# Patient Record
Sex: Female | Born: 1985 | Race: White | Hispanic: No | Marital: Married | Smoking: Former smoker
Health system: Southern US, Community
[De-identification: ages and names within clinical notes are randomized; demographics above are authoritative.]

## PROBLEM LIST (undated history)

## (undated) DIAGNOSIS — Z950 Presence of cardiac pacemaker: Secondary | ICD-10-CM

## (undated) DIAGNOSIS — I509 Heart failure, unspecified: Secondary | ICD-10-CM

## (undated) HISTORY — PX: CARDIAC CATHETERIZATION: SHX172

## (undated) HISTORY — PX: PACEMAKER PLACEMENT: SHX43

---

## 2010-01-16 ENCOUNTER — Emergency Department: Payer: Self-pay | Admitting: Emergency Medicine

## 2010-01-26 ENCOUNTER — Emergency Department: Payer: Self-pay | Admitting: Emergency Medicine

## 2011-07-17 ENCOUNTER — Emergency Department: Payer: Self-pay | Admitting: Emergency Medicine

## 2012-09-30 ENCOUNTER — Emergency Department: Payer: Self-pay | Admitting: Emergency Medicine

## 2012-09-30 LAB — COMPREHENSIVE METABOLIC PANEL
BUN: 10 mg/dL (ref 7–18)
Calcium, Total: 9.3 mg/dL (ref 8.5–10.1)
Chloride: 109 mmol/L — ABNORMAL HIGH (ref 98–107)
Creatinine: 0.79 mg/dL (ref 0.60–1.30)
EGFR (African American): >60
EGFR (Non-African Amer.): >60
Glucose: 84 mg/dL (ref 65–99)
Osmolality: 281 (ref 275–301)
SGOT(AST): 25 U/L (ref 15–37)
Sodium: 142 mmol/L (ref 136–145)
Total Protein: 7.2 g/dL (ref 6.4–8.2)

## 2012-09-30 LAB — CBC WITH DIFFERENTIAL/PLATELET
Eosinophil #: 0.2 10*3/uL (ref 0.0–0.7)
Eosinophil %: 2.2 %
HCT: 35 % (ref 35.0–47.0)
HGB: 11.7 g/dL — ABNORMAL LOW (ref 12.0–16.0)
Lymphocyte %: 31.3 %
MCHC: 33.5 g/dL (ref 32.0–36.0)
MCV: 84 fL (ref 80–100)
Monocyte #: 0.4 x10 3/mm (ref 0.2–0.9)
Monocyte %: 4.8 %
Neutrophil #: 4.9 10*3/uL (ref 1.4–6.5)
Neutrophil %: 60.5 %
RBC: 4.18 10*6/uL (ref 3.80–5.20)
RDW: 14.5 % (ref 11.5–14.5)

## 2012-09-30 LAB — TROPONIN I: Troponin-I: <0.02 ng/mL

## 2013-06-02 LAB — URINALYSIS, COMPLETE
Bacteria: NONE SEEN
Bilirubin,UR: NEGATIVE
Glucose,UR: NEGATIVE mg/dL (ref 0–75)
KETONE: NEGATIVE
Leukocyte Esterase: NEGATIVE
Nitrite: NEGATIVE
PH: 8 (ref 4.5–8.0)
Protein: NEGATIVE
RBC,UR: 1 /HPF (ref 0–5)
SPECIFIC GRAVITY: 1.011 (ref 1.003–1.030)
WBC UR: 2 /HPF (ref 0–5)

## 2013-06-02 LAB — COMPREHENSIVE METABOLIC PANEL
ALK PHOS: 78 U/L
ANION GAP: 4 — AB (ref 7–16)
AST: 20 U/L (ref 15–37)
Albumin: 3.7 g/dL (ref 3.4–5.0)
BUN: 9 mg/dL (ref 7–18)
Bilirubin,Total: 0.2 mg/dL (ref 0.2–1.0)
CHLORIDE: 109 mmol/L — AB (ref 98–107)
CO2: 27 mmol/L (ref 21–32)
Calcium, Total: 8.8 mg/dL (ref 8.5–10.1)
Creatinine: 0.81 mg/dL (ref 0.60–1.30)
EGFR (African American): >60
EGFR (Non-African Amer.): >60
Glucose: 79 mg/dL (ref 65–99)
OSMOLALITY: 277 (ref 275–301)
POTASSIUM: 3.5 mmol/L (ref 3.5–5.1)
SGPT (ALT): 19 U/L (ref 12–78)
Sodium: 140 mmol/L (ref 136–145)
Total Protein: 7.3 g/dL (ref 6.4–8.2)

## 2013-06-02 LAB — CBC
HCT: 37.2 % (ref 35.0–47.0)
HGB: 12.4 g/dL (ref 12.0–16.0)
MCH: 28.6 pg (ref 26.0–34.0)
MCHC: 33.4 g/dL (ref 32.0–36.0)
MCV: 86 fL (ref 80–100)
PLATELETS: 206 10*3/uL (ref 150–440)
RBC: 4.34 10*6/uL (ref 3.80–5.20)
RDW: 13.8 % (ref 11.5–14.5)
WBC: 8.7 10*3/uL (ref 3.6–11.0)

## 2013-06-02 LAB — CK TOTAL AND CKMB (NOT AT ARMC)
CK, Total: 58 U/L
CK-MB: <0.5 ng/mL — ABNORMAL LOW (ref 0.5–3.6)

## 2013-06-02 LAB — TROPONIN I: Troponin-I: <0.02 ng/mL

## 2013-06-02 LAB — LIPASE, BLOOD: Lipase: 117 U/L (ref 73–393)

## 2013-06-03 ENCOUNTER — Observation Stay: Payer: Self-pay | Admitting: Internal Medicine

## 2013-06-03 LAB — TROPONIN I: Troponin-I: <0.02 ng/mL

## 2013-06-03 LAB — CK-MB: CK-MB: 0.6 ng/mL (ref 0.5–3.6)

## 2013-07-26 ENCOUNTER — Emergency Department: Payer: Self-pay | Admitting: Emergency Medicine

## 2013-07-26 LAB — URINALYSIS, COMPLETE
BILIRUBIN, UR: NEGATIVE
Bacteria: NONE SEEN
Glucose,UR: NEGATIVE mg/dL (ref 0–75)
Ketone: NEGATIVE
LEUKOCYTE ESTERASE: NEGATIVE
NITRITE: NEGATIVE
Ph: 5 (ref 4.5–8.0)
Protein: NEGATIVE
RBC,UR: <1 /HPF (ref 0–5)
SPECIFIC GRAVITY: 1.005 (ref 1.003–1.030)
Squamous Epithelial: 6

## 2013-07-26 LAB — CBC WITH DIFFERENTIAL/PLATELET
Basophil #: 0 10*3/uL (ref 0.0–0.1)
Basophil %: 0.5 %
Eosinophil #: 0.2 10*3/uL (ref 0.0–0.7)
Eosinophil %: 2.3 %
HCT: 40.2 % (ref 35.0–47.0)
HGB: 12.9 g/dL (ref 12.0–16.0)
LYMPHS PCT: 32 %
Lymphocyte #: 2.6 10*3/uL (ref 1.0–3.6)
MCH: 28 pg (ref 26.0–34.0)
MCHC: 32.2 g/dL (ref 32.0–36.0)
MCV: 87 fL (ref 80–100)
MONO ABS: 0.5 x10 3/mm (ref 0.2–0.9)
MONOS PCT: 5.7 %
NEUTROS PCT: 59.5 %
Neutrophil #: 4.8 10*3/uL (ref 1.4–6.5)
PLATELETS: 211 10*3/uL (ref 150–440)
RBC: 4.62 10*6/uL (ref 3.80–5.20)
RDW: 13.1 % (ref 11.5–14.5)
WBC: 8.2 10*3/uL (ref 3.6–11.0)

## 2013-07-26 LAB — COMPREHENSIVE METABOLIC PANEL
Albumin: 4 g/dL (ref 3.4–5.0)
Alkaline Phosphatase: 81 U/L
Anion Gap: 5 — ABNORMAL LOW (ref 7–16)
BILIRUBIN TOTAL: 0.2 mg/dL (ref 0.2–1.0)
BUN: 10 mg/dL (ref 7–18)
CALCIUM: 9.4 mg/dL (ref 8.5–10.1)
Chloride: 109 mmol/L — ABNORMAL HIGH (ref 98–107)
Co2: 25 mmol/L (ref 21–32)
Creatinine: 0.88 mg/dL (ref 0.60–1.30)
EGFR (Non-African Amer.): >60
Glucose: 81 mg/dL (ref 65–99)
OSMOLALITY: 276 (ref 275–301)
Potassium: 4.1 mmol/L (ref 3.5–5.1)
SGOT(AST): 18 U/L (ref 15–37)
SGPT (ALT): 14 U/L (ref 12–78)
SODIUM: 139 mmol/L (ref 136–145)
Total Protein: 7.8 g/dL (ref 6.4–8.2)

## 2013-07-26 LAB — WET PREP, GENITAL

## 2013-07-26 LAB — HCG, QUANTITATIVE, PREGNANCY

## 2013-07-26 LAB — GC/CHLAMYDIA PROBE AMP

## 2013-10-04 ENCOUNTER — Emergency Department: Payer: Self-pay | Admitting: Internal Medicine

## 2013-10-04 LAB — COMPREHENSIVE METABOLIC PANEL
AST: 20 U/L (ref 15–37)
Albumin: 3.9 g/dL (ref 3.4–5.0)
Alkaline Phosphatase: 77 U/L
Anion Gap: 6 — ABNORMAL LOW (ref 7–16)
BUN: 6 mg/dL — ABNORMAL LOW (ref 7–18)
Bilirubin,Total: 0.3 mg/dL (ref 0.2–1.0)
CO2: 25 mmol/L (ref 21–32)
Calcium, Total: 9.4 mg/dL (ref 8.5–10.1)
Chloride: 107 mmol/L (ref 98–107)
Creatinine: 0.88 mg/dL (ref 0.60–1.30)
EGFR (African American): >60
GLUCOSE: 129 mg/dL — AB (ref 65–99)
OSMOLALITY: 275 (ref 275–301)
Potassium: 4 mmol/L (ref 3.5–5.1)
SGPT (ALT): 22 U/L (ref 12–78)
SODIUM: 138 mmol/L (ref 136–145)
Total Protein: 8 g/dL (ref 6.4–8.2)

## 2013-10-04 LAB — WET PREP, GENITAL

## 2013-10-04 LAB — URINALYSIS, COMPLETE
BILIRUBIN, UR: NEGATIVE
Blood: NEGATIVE
Glucose,UR: NEGATIVE mg/dL (ref 0–75)
Ketone: NEGATIVE
LEUKOCYTE ESTERASE: NEGATIVE
Nitrite: NEGATIVE
Ph: 7 (ref 4.5–8.0)
Protein: NEGATIVE
Specific Gravity: 1.016 (ref 1.003–1.030)
WBC UR: 3 /HPF (ref 0–5)

## 2013-10-04 LAB — CBC WITH DIFFERENTIAL/PLATELET
Basophil #: 0.1 10*3/uL (ref 0.0–0.1)
Basophil %: 0.7 %
Eosinophil #: 0.2 10*3/uL (ref 0.0–0.7)
Eosinophil %: 1.5 %
HCT: 40.3 % (ref 35.0–47.0)
HGB: 13.2 g/dL (ref 12.0–16.0)
LYMPHS PCT: 21.9 %
Lymphocyte #: 2.3 10*3/uL (ref 1.0–3.6)
MCH: 28.6 pg (ref 26.0–34.0)
MCHC: 32.6 g/dL (ref 32.0–36.0)
MCV: 88 fL (ref 80–100)
MONO ABS: 0.3 x10 3/mm (ref 0.2–0.9)
Monocyte %: 2.9 %
Neutrophil #: 7.6 10*3/uL — ABNORMAL HIGH (ref 1.4–6.5)
Neutrophil %: 73 %
Platelet: 215 10*3/uL (ref 150–440)
RBC: 4.6 10*6/uL (ref 3.80–5.20)
RDW: 13.7 % (ref 11.5–14.5)
WBC: 10.4 10*3/uL (ref 3.6–11.0)

## 2013-10-04 LAB — GC/CHLAMYDIA PROBE AMP

## 2014-06-21 ENCOUNTER — Emergency Department
Admit: 2014-06-21 | Disposition: A | Discharge status: Home / Self Care | Payer: Self-pay | Admitting: Emergency Medicine

## 2014-06-27 ENCOUNTER — Emergency Department
Admit: 2014-06-27 | Disposition: A | Discharge status: Home / Self Care | Payer: Self-pay | Admitting: Emergency Medicine

## 2014-07-15 NOTE — Consult Note (Signed)
PATIENT NAME:  Tina Mora, Tina Mora MR#:  [id number] DATE OF BIRTH:  [birth year]  DATE OF CONSULTATION:  06/03/2013  REFERRING PHYSICIAN:  Cletis Athens. Hower, MD CONSULTING PHYSICIAN:  Nollie Terlizzi D. Guadalupe Kerekes, MD  INDICATION: Aortic stenosis and atypical chest pain, known congenital heart disease.   HISTORY OF PRESENT ILLNESS: Ms. Tina Mora is a 29 year old white female with history of pediatric heart defect with congenital disease. 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 She does have a history of permanent pacemaker for sick sinus syndrome. Began with chest pain symptoms. Describes chest pain over the last 3 days, constant,  unrelenting, retrosternal, sharp in nature, 10/10. Has had constant chest pain without radiation. No factors that relieve or worsen the pain. She associates it with mild shortness of breath and occasional nausea but no vomiting. Denies fever, chills, sweats, weight loss, weight gain. She had received morphine in the Emergency Room, which seemed to help her symptoms. She says she has tried aspirin at home and some NSAIDs. She has had pain like this in the past. Has not followed up with her cardiologist at [facility] in several years. She is supposed to see Dr. Jackson Latino at [facility] and Dr. Mary Sella with EP, but for some has not followed up.   REVIEW OF SYSTEMS: No blackout spells or syncope. Denies nausea or vomiting. No fever, no chills, no sweats, no weight loss, no weight gain. No hemoptysis or hematemesis. No bright red blood per rectum.   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   PAST SURGICAL HISTORY: She has had open heart surgeries for correction of tetralogy, as well as permanent pacemaker placement. She has had revision of the pacemaker as well  years ago.   SOCIAL HISTORY: She says she quit smoking 18 months ago. No alcohol consumption. Lives at home with I think a  boyfriend. She has had 2 children which were adopted from her, but she has stepchildren.   FAMILY HISTORY: Coronary artery disease, CVA, COPD.   ALLERGIES: AUGMENTIN.   HOME MEDICATIONS: None.   PHYSICAL EXAMINATION: VITAL SIGNS: Blood pressure was 140/50, heart rate of 70, respiratory rate of 18, afebrile.  HEENT: Normocephalic, atraumatic. Pupils equal and reactive to light.  NECK: Supple. No significant JVD, bruits or adenopathy.  LUNGS: Clear to auscultation and percussion. No significant wheeze, rhonchi or rale.  HEART: Regular rate and rhythm. Systolic ejection murmur at the  left sternal border, harsh III/VI systolic ejection murmur. Positive S4.  ABDOMEN: Benign.  EXTREMITIES: Within normal limits.  NEUROLOGIC: Intact.  SKIN: Normal.   LABORATORY, DIAGNOSTIC, AND RADIOLOGICAL DATA: EKG: Paced rhythm at 70. Chest x-ray unremarkable. CT of the chest showed no pulmonary embolus. Cardiac anomaly. Tortuosity of the aortic arch. Descending aorta has normal caliber. Right subclavian artery courses posterior to the esophagus.   Sodium 140, potassium 3.5, chloride 109, bicarbonate 27, BUN 9, creatinine 0.81, glucose of 79. LFTs normal. Troponin less than 0.02. White count of 8.7, hemoglobin 12.4, platelet count of 26. Urinalysis was negative.   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   PLAN:  1.  Atypical chest pain probably. I do not believe that she has an acute coronary syndrome. I do not believe she has pericarditis. Would recommend conservative therapy in terms of the pain. Do not recommend narcotics  at this point. Will have the patient follow up with her regular cardiologist for further evaluation.  2.  For murmur, she appears to have aortic stenosis on echocardiogram. Would [facility] for [facility] treatment and  intervention.  3.  For pacemaker, will [facility]. She probably needs pacemaker followup, which she has not been doing. 4.  I have advised the patient to continue to refrain from smoking.  5.  Once the patient has 3 negative cardiac enzymes, will treat the patient conservatively medically. I am concerned about compliance. It is her biggest problem at this point, for unclear reasons, but I do not believe that her chest pain is truly acute coronary syndrome, so continued narcotic therapy I think should be discouraged and have the patient follow up, as she should, with [facility].  ____________________________ Bobbie Stackwayne D. Juliann Paresallwood, MD ddc:jcm D: 06/04/2013 14:09:02 ET T: 06/04/2013 19:53:04 ET JOB#: [id number]  cc: Chayanne Filippi D. Juliann Paresallwood, MD, <Dictator> Alwyn PeaWAYNE D Esli Clements MD ELECTRONICALLY SIGNED 07/05/2013 12:34

## 2014-07-15 NOTE — Consult Note (Signed)
Brief Consult Note: Diagnosis: AS/Murmur /Atypical CP.   Patient was seen by consultant.   Consult note dictated.   Discussed with Attending MD.   Comments: IMP Atypical CP left ear Murmur SSS Obesity Smoking (quit) Congenital Heart disease . PLAN ASA81 daily PPM interregation as an outpt [facility]/Congenital Heart disease I do not rec narcotics for cp Consider ultram/NSAIDs I do not believe this is ACS I believe it is ok to have her f/u as out.  Electronic Signatures: Dorothyann Pengallwood, Dwayne D (MD)  (Signed 13-Mar-15 16:34)  Authored: Brief Consult Note   Last Updated: 13-Mar-15 16:34 by Alwyn Peaallwood, Dwayne D (MD)

## 2014-07-15 NOTE — Discharge Summary (Signed)
PATIENT NAME:  Tina Mora, Tina Mora MR#:  [id number] DATE OF BIRTH:  1985/09/16  DATE OF ADMISSION:  06/03/2013  DATE OF DISCHARGE:  06/03/2013  DISCHARGE DIAGNOSES:  1.  Musculoskeletal chest pain.  2.  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   DISCHARGE MEDICATIONS: The patient advised to take ibuprofen 800 mg t.i.d. as needed for chest pain, but no other medications are listed.   CONSULTATIONS: Cardiology consult with Dr. Clayborn Bigness.   The patient advised to keep appointment with her cardiologist at [facility].   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 Came in because of chest pain. The patient did not have any followup recently with her cardiologist at [facility]. Did not see him at least for 2 years. The patient came in because of chest pain. The patient has a history of permanent pacemaker. The patient had chest pain of 3 days duration with no trouble breathing, no cough, no nausea, no vomiting. Troponins have been negative x 3. The patient's echocardiogram showed normal ejection fraction of 60%, with severe aortic stenosis. The patient was seen by Dr. Clayborn Bigness, recommended that she can follow up with her cardiologist as an outpatient. No emergency to transfer her to [facility]. The patient received aspirin and nitroglycerin and atorvastatin during the hospital stay, and she also started on morphine 2 to 4 mg q. 4 hours p.r.n. for pain. The patient's CT of the chest was done for pulmonary emboli, which did not show any PE. The patient's CBC and met-B were within normal limits. The patient continued to ask for pain medication, and we just stopped those. She was advised to follow up with her [place] cardiologist for congenital heart disease follow up. Dr. Clayborn Bigness said there is no reason to transfer her, and he did not believe it is acute coronary  syndrome, and we have stopped the narcotics. There was a question that she was needing narcotics on a regular basis, even though she was sleeping, so I have stopped those pain meds and we thought maybe she had narcotic-seeking behavior.   Anyway, she went home in stable condition. She has a history of obesity and smoking, but the chest pain is thought to be musculoskeletal. I advised her to just take ibuprofen as needed and follow up with the [facility] cardiologist.   Time spent on discharge preparation: More than 30 minutes.   Her primary doctor is from [place].    ____________________________ Epifanio Lesches, MD sk:mr D: 06/05/2013 10:41:00 ET T: 06/05/2013 20:17:07 ET JOB#: [id number]  cc: Epifanio Lesches, MD, <Dictator> Epifanio Lesches MD ELECTRONICALLY SIGNED 06/06/2013 12:17

## 2014-07-15 NOTE — H&P (Signed)
PATIENT NAME:  Tina Mora, DONNA MR#:  [id number] DATE OF BIRTH:  01-15-1986  DATE OF ADMISSION:  06/03/2013  REFERRING PHYSICIAN: Dr. Bayard Males.   PRIMARY CARE PHYSICIAN: None.   CHIEF COMPLAINT: Chest pain.   HISTORY OF PRESENT ILLNESS: A 29 year old Caucasian female with a past medical history of pediatric heart defects of which she is actually unsure of the details, although this is possible Tetralogy of Fallot. She does have a permanent pacemaker placed. She is presenting with chest pain. Describes chest pain for 3 days' duration, constant, retrosternal, sharp in the retrosternal location, sharp in quality, 10 out of 10 in intensity and once again pain has been constant without radiation. No worsening or relieving factors. She has associated mild shortness of breath as well as nausea. However, denies any fevers, chills, cough, abdominal pain or further symptomatology. She received morphine in the Emergency Department and now is without complaints.   REVIEW OF SYSTEMS: CONSTITUTIONAL: Denies fever, fatigue, weakness. EYES: Denies blurred vision, double vision, eye pain. EARS, NOSE, THROAT: Denies tinnitus, ear pain, hearing loss. RESPIRATORY: Denies cough, wheeze. Positive shortness of breath.  CARDIOVASCULAR: Positive for chest pain as described above. Denies any orthopnea, edema, palpitations. GASTROINTESTINAL: Denies nausea, vomiting, diarrhea, abdominal pain. GENITOURINARY: Denies dysuria, hematuria. ENDOCRINE: Denies nocturia or thyroid problems. HEMATOLOGY: Denies easy bruising or bleeding. SKIN: Denies rash or lesions.  MUSCULOSKELETAL: Denies pain in neck, back, shoulder, knees, hips or arthritic symptoms.  NEUROLOGIC: Denies paralysis, paresthesias. PSYCHIATRIC: Denies anxiety or depressive symptoms. Otherwise, full review of systems performed by me is negative.   PAST MEDICAL HISTORY: Pediatric heart defect. Her and her mother are unaware of the details, though from their past  recollection she has aortic stenosis, patent ductus arteriosus, coarctation of the aorta. She has had open heart surgery, though they are unaware of what was actually corrected. They also mention that she possibly has Tetralogy of Fallot.   SOCIAL HISTORY: Ex-tobacco use. Last smoked approximately 8 months ago. Denies any alcohol or drug use.   FAMILY HISTORY: Positive for coronary artery disease as well as CVAs in her father and COPD in mother.   ALLERGIES: AUGMENTIN.   HOME MEDICATIONS: She takes no medications.   PHYSICAL EXAMINATION:  VITAL SIGNS: Temperature 97.9, heart rate 70, respirations 22, blood pressure 139/38, saturating 99% on room air. Weight 77.1 kg, BMI 34.4.  GENERAL: Well-nourished, well-developed, Caucasian female, currently in no acute distress.  HEAD: Normocephalic, atraumatic. Eyes: Pupils equal, round, and reactive to light. Extraocular muscles intact. No scleral icterus. Mouth: Moist mucous membranes. Dentition intact. No abscess noted. Ears, nose and throat clear. No exudates. No xanthelasma.  NECK: Supple. No thyromegaly. No nodules. No JVD.  PULMONARY: Clear to auscultation bilaterally. No wheezes, rales or rhonchi. No use of accessory muscles. Good respiratory effort.  CHEST: Tender to palpation over the sternum reproducing similar symptoms, though milder in severity.  CARDIOVASCULAR: S1 and S2 with 3 out of  6 systolic ejection murmur best heard at right upper sternal border. No pedal edema. Pedal pulses 2+ bilaterally.  GASTROINTESTINAL: Soft, nontender, nondistended. No masses. Obese. Positive bowel sounds. No hepatosplenomegaly.  MUSCULOSKELETAL: No swelling, clubbing or edema. Range of motion full in all extremities.  NEUROLOGIC: Cranial nerves II through XII intact. No gross neurologic deficits. Sensation intact. Reflexes intact.  SKIN: No ulcerations, lesions, rashes, cyanosis. Skin warm and dry. Turgor intact.  PSYCHIATRIC: Mood and affect within normal  limits. The patient is awake, alert and oriented x 3. Insight and judgment intact.  LABORATORY DATA: EKG performed revealing ventricular paced rhythm. She also has a chest x-ray performed revealing no acute cardiopulmonary process; however, it does make mention of a permanent pacemaker as well as sternotomy wires. CT chest performed revealing no acute PE, no acute intrathoracic process. The cardiac anatomy is somewhat limited given the streak artifact and pacemaker; however, there is mild ectasia of the ascending aorta of 3.2 cm and tortuosity along the arch. Descending aorta is of normal caliber. Right subclavian artery courses posterior to the esophagus. Remainder of laboratory data: Sodium 140, potassium 3.5, chloride 109, bicarbonate 27, BUN 9, creatinine 0.81, glucose 79. LFTs within normal limits. Troponin I less than 0.02. WBC 8.7, hemoglobin 12.4, platelets of 206. Urinalysis negative for evidence of infection.   ASSESSMENT AND PLAN: A 29 year old Caucasian female with past medical history of pediatric heart defect, possibly Tetralogy of Fallot. She is status post repair given history of heart surgeries as well as sternotomy wires; however, she is unaware of the details, presenting with chest pain.  1.  Chest pain. She was given aspirin and placed on telemetry. She was given statin therapy. Cardiology consult. Trending cardiac enzymes x 3. For her cardiac history, she was previously followed by [facility]; however, she has been lost to followup and has not been seen in greater than 2 years. Transthoracic echocardiogram. 2.  VTE prophylaxis with heparin subcu.   CODE STATUS: The patient is full code.   TIME SPENT: 45 minutes.   ____________________________ Cletis Athensavid K. Zamyiah Tino, MD dkh:aw D: 06/03/2013 01:35:24 ET T: 06/03/2013 06:09:49 ET JOB#: [id number]  cc: Cletis Athensavid K. Angelise Petrich, MD, <Dictator> Eliakim Tendler Synetta ShadowK Kaile Bixler MD ELECTRONICALLY SIGNED 06/03/2013 20:33

## 2015-01-09 ENCOUNTER — Emergency Department
Admission: EM | Admit: 2015-01-09 | Discharge: 2015-01-09 | Discharge status: Against Medical Advice | Payer: Self-pay | Attending: Emergency Medicine | Admitting: Emergency Medicine

## 2015-01-09 ENCOUNTER — Encounter: Payer: Self-pay | Admitting: *Deleted

## 2015-01-09 DIAGNOSIS — R1031 Right lower quadrant pain: Secondary | ICD-10-CM | POA: Insufficient documentation

## 2015-01-09 DIAGNOSIS — R110 Nausea: Secondary | ICD-10-CM | POA: Insufficient documentation

## 2015-01-09 HISTORY — DX: Presence of cardiac pacemaker: Z95.0

## 2015-01-09 LAB — COMPREHENSIVE METABOLIC PANEL
ALK PHOS: 81 U/L (ref 38–126)
ALT: 27 U/L (ref 14–54)
AST: 24 U/L (ref 15–41)
Albumin: 4.2 g/dL (ref 3.5–5.0)
Anion gap: 6 (ref 5–15)
BUN: 9 mg/dL (ref 6–20)
CALCIUM: 9.6 mg/dL (ref 8.9–10.3)
CO2: 26 mmol/L (ref 22–32)
CREATININE: 0.7 mg/dL (ref 0.44–1.00)
Chloride: 108 mmol/L (ref 101–111)
GFR calc non Af Amer: >60 mL/min (ref >60)
Glucose, Bld: 121 mg/dL — ABNORMAL HIGH (ref 65–99)
Potassium: 3.6 mmol/L (ref 3.5–5.1)
SODIUM: 140 mmol/L (ref 135–145)
Total Bilirubin: 0.2 mg/dL — ABNORMAL LOW (ref 0.3–1.2)
Total Protein: 7.6 g/dL (ref 6.5–8.1)

## 2015-01-09 LAB — URINALYSIS COMPLETE WITH MICROSCOPIC (ARMC ONLY)
BILIRUBIN URINE: NEGATIVE
Glucose, UA: NEGATIVE mg/dL
KETONES UR: NEGATIVE mg/dL
Nitrite: NEGATIVE
Protein, ur: NEGATIVE mg/dL
Specific Gravity, Urine: 1.015 (ref 1.005–1.030)
pH: 7 (ref 5.0–8.0)

## 2015-01-09 LAB — LIPASE, BLOOD: Lipase: 25 U/L (ref 22–51)

## 2015-01-09 LAB — CBC
HCT: 37.1 % (ref 35.0–47.0)
Hemoglobin: 12.2 g/dL (ref 12.0–16.0)
MCH: 27.9 pg (ref 26.0–34.0)
MCHC: 32.8 g/dL (ref 32.0–36.0)
MCV: 85 fL (ref 80.0–100.0)
PLATELETS: 236 10*3/uL (ref 150–440)
RBC: 4.37 MIL/uL (ref 3.80–5.20)
RDW: 14.5 % (ref 11.5–14.5)
WBC: 7.6 10*3/uL (ref 3.6–11.0)

## 2015-01-09 LAB — POCT PREGNANCY, URINE: Preg Test, Ur: NEGATIVE

## 2015-01-09 NOTE — ED Notes (Signed)
Pt reports RLQ abdominal pain x 3 days, worsening. Nausea, no vomiting.

## 2015-01-13 ENCOUNTER — Emergency Department
Admission: EM | Admit: 2015-01-13 | Discharge: 2015-01-13 | Disposition: A | Discharge status: Home / Self Care | Payer: Self-pay | Attending: Emergency Medicine | Admitting: Emergency Medicine

## 2015-01-13 ENCOUNTER — Emergency Department: Payer: Self-pay

## 2015-01-13 DIAGNOSIS — N390 Urinary tract infection, site not specified: Secondary | ICD-10-CM | POA: Insufficient documentation

## 2015-01-13 DIAGNOSIS — Z79899 Other long term (current) drug therapy: Secondary | ICD-10-CM | POA: Insufficient documentation

## 2015-01-13 DIAGNOSIS — R1031 Right lower quadrant pain: Secondary | ICD-10-CM

## 2015-01-13 DIAGNOSIS — Z3202 Encounter for pregnancy test, result negative: Secondary | ICD-10-CM | POA: Insufficient documentation

## 2015-01-13 DIAGNOSIS — Z87891 Personal history of nicotine dependence: Secondary | ICD-10-CM | POA: Insufficient documentation

## 2015-01-13 HISTORY — DX: Heart failure, unspecified: I50.9

## 2015-01-13 LAB — COMPREHENSIVE METABOLIC PANEL
ALT: 29 U/L (ref 14–54)
AST: 24 U/L (ref 15–41)
Albumin: 4.7 g/dL (ref 3.5–5.0)
Alkaline Phosphatase: 77 U/L (ref 38–126)
Anion gap: 5 (ref 5–15)
BUN: 8 mg/dL (ref 6–20)
CHLORIDE: 108 mmol/L (ref 101–111)
CO2: 25 mmol/L (ref 22–32)
CREATININE: 0.68 mg/dL (ref 0.44–1.00)
Calcium: 10.3 mg/dL (ref 8.9–10.3)
GFR calc Af Amer: >60 mL/min (ref >60)
Glucose, Bld: 112 mg/dL — ABNORMAL HIGH (ref 65–99)
POTASSIUM: 3.8 mmol/L (ref 3.5–5.1)
SODIUM: 138 mmol/L (ref 135–145)
Total Bilirubin: 0.5 mg/dL (ref 0.3–1.2)
Total Protein: 8.3 g/dL — ABNORMAL HIGH (ref 6.5–8.1)

## 2015-01-13 LAB — POCT PREGNANCY, URINE: PREG TEST UR: NEGATIVE

## 2015-01-13 LAB — CBC
HEMATOCRIT: 41.2 % (ref 35.0–47.0)
Hemoglobin: 13.5 g/dL (ref 12.0–16.0)
MCH: 27.7 pg (ref 26.0–34.0)
MCHC: 32.8 g/dL (ref 32.0–36.0)
MCV: 84.6 fL (ref 80.0–100.0)
PLATELETS: 273 10*3/uL (ref 150–440)
RBC: 4.88 MIL/uL (ref 3.80–5.20)
RDW: 14.5 % (ref 11.5–14.5)
WBC: 9.1 10*3/uL (ref 3.6–11.0)

## 2015-01-13 LAB — URINALYSIS COMPLETE WITH MICROSCOPIC (ARMC ONLY)
Bilirubin Urine: NEGATIVE
Glucose, UA: NEGATIVE mg/dL
HGB URINE DIPSTICK: NEGATIVE
Ketones, ur: NEGATIVE mg/dL
Nitrite: NEGATIVE
PH: 7 (ref 5.0–8.0)
Protein, ur: 30 mg/dL — AB
Specific Gravity, Urine: 1.018 (ref 1.005–1.030)

## 2015-01-13 LAB — LIPASE, BLOOD: LIPASE: 21 U/L (ref 11–51)

## 2015-01-13 MED ORDER — IOHEXOL 240 MG/ML SOLN
25.0000 mL | Freq: Once | INTRAMUSCULAR | Status: AC | PRN
  Administered 2015-01-13: 25 mL via ORAL

## 2015-01-13 MED ORDER — KETOROLAC TROMETHAMINE 30 MG/ML IJ SOLN
30.0000 mg | Freq: Once | INTRAMUSCULAR | Status: DC
Start: 2015-01-13 — End: 2015-01-13
  Filled 2015-01-13: qty 1

## 2015-01-13 MED ORDER — TRAMADOL HCL 50 MG PO TABS: 50.0000 mg | ORAL_TABLET | Freq: Four times a day (QID) | ORAL | Status: AC | PRN

## 2015-01-13 MED ORDER — CIPROFLOXACIN HCL 500 MG PO TABS: 500.0000 mg | ORAL_TABLET | Freq: Two times a day (BID) | ORAL | Status: DC

## 2015-01-13 MED ORDER — IOHEXOL 300 MG/ML  SOLN
100.0000 mL | Freq: Once | INTRAMUSCULAR | Status: AC | PRN
  Administered 2015-01-13: 100 mL via INTRAVENOUS

## 2015-01-13 MED ORDER — KETOROLAC TROMETHAMINE 30 MG/ML IJ SOLN
30.0000 mg | Freq: Once | INTRAMUSCULAR | Status: AC
  Administered 2015-01-13: 30 mg via INTRAVENOUS

## 2015-01-13 NOTE — Discharge Instructions (Signed)

## 2015-01-13 NOTE — ED Notes (Signed)
Pt c/o rlq pain x5 days radiating into back with nausea. C/o hesitance with urination, and increase frequency.

## 2015-01-13 NOTE — ED Provider Notes (Signed)
[facility] Emergency Department Provider Note  ____________________________________________  Time seen: On arrival  I have reviewed the triage vital signs and the nursing notes.   HISTORY  Chief Complaint Abdominal Pain    HPI Tina Mora is a 29 y.o. female who presents with complaints of right lower quadrant abdominal pain. She reports the pain is mild to moderate and crampy in nature. It is been constant for approximately 5 days. She denies dysuria or hematuria but she has had some frequency. She denies fevers chills. Positive nausea, no vomiting. Normal stools. She is not had this pain before. She denies vaginal discharge. Nothing seems to make it better. Patient has a history of congenital heart disease     Past Medical History  Diagnosis Date  . Pacemaker   . CHF (congestive heart failure) (HCC)     There are no active problems to display for this patient.   Past Surgical History  Procedure Laterality Date  . Pacemaker placement    . Cardiac catheterization      Current Outpatient Rx  Name  Route  Sig  Dispense  Refill  . furosemide (LASIX) 20 MG tablet   Oral   Take 20 mg by mouth daily.      10   . ondansetron (ZOFRAN-ODT) 4 MG disintegrating tablet   Oral   Take 4 mg by mouth every 8 (eight) hours as needed. For nausea.      0   . ciprofloxacin (CIPRO) 500 MG tablet   Oral   Take 1 tablet (500 mg total) by mouth 2 (two) times daily.   14 tablet   0   . traMADol (ULTRAM) 50 MG tablet   Oral   Take 1 tablet (50 mg total) by mouth every 6 (six) hours as needed.   20 tablet   0     Allergies Augmentin  History reviewed. No pertinent family history.  Social History Social History  Substance Use Topics  . Smoking status: Former Games developer  . Smokeless tobacco: None  . Alcohol Use: No    Review of Systems  Constitutional: Negative for fever. Eyes: Negative for visual changes. ENT: Negative for sore  throat Cardiovascular: Negative for chest pain. Respiratory: Negative for shortness of breath. Gastrointestinal: Positive for abdominal pain Genitourinary: Negative for dysuria. Positive for hesitancy Musculoskeletal: Negative for back pain. Skin: Negative for rash. Neurological: Negative for headaches or focal weakness Psychiatric: No anxiety    ____________________________________________   PHYSICAL EXAM:  VITAL SIGNS: ED Triage Vitals  Enc Vitals Group     BP 01/13/15 1717 127/101 mmHg     Pulse Rate 01/13/15 1717 85     Resp 01/13/15 1717 18     Temp 01/13/15 1717 98.4 F (36.9 C)     Temp Source 01/13/15 1717 Oral     SpO2 01/13/15 1717 100 %     Weight 01/13/15 1717 166 lb (75.297 kg)     Height 01/13/15 1717  (1.499 m)     Head Cir --      Peak Flow --      Pain Score 01/13/15 1725 10     Pain Loc --      Pain Edu? --      Excl. in GC? --      Constitutional: Alert and oriented. Well appearing and in no distress. Eyes: Conjunctivae are normal.  ENT   Head: Normocephalic and atraumatic.   Mouth/Throat: Mucous membranes are moist. Cardiovascular: Normal rate,  regular rhythm. Normal and symmetric distal pulses are present in all extremities. No murmurs, rubs, or gallops. Respiratory: Normal respiratory effort without tachypnea nor retractions. Breath sounds are clear and equal bilaterally.  Gastrointestinal: Mild tenderness to palpation in the right lower quadrant. No distention. There is no CVA tenderness. Genitourinary: deferred Musculoskeletal: Nontender with normal range of motion in all extremities. No lower extremity tenderness nor edema. Neurologic:  Normal speech and language. No gross focal neurologic deficits are appreciated. Skin:  Skin is warm, dry and intact. No rash noted. Psychiatric: Mood and affect are normal. Patient exhibits appropriate insight and judgment.  ____________________________________________    LABS (pertinent  positives/negatives)  Labs Reviewed  COMPREHENSIVE METABOLIC PANEL - Abnormal; Notable for the following:    Glucose, Bld 112 (*)    Total Protein 8.3 (*)    All other components within normal limits  URINALYSIS COMPLETEWITH MICROSCOPIC ([facility] ONLY) - Abnormal; Notable for the following:    Color, Urine YELLOW (*)    APPearance HAZY (*)    Protein, ur 30 (*)    Leukocytes, UA 1+ (*)    Bacteria, UA RARE (*)    Squamous Epithelial / LPF 6-30 (*)    All other components within normal limits  LIPASE, BLOOD  CBC  POCT PREGNANCY, URINE  POC URINE PREG, ED    ____________________________________________   EKG  None  ____________________________________________    RADIOLOGY I have personally reviewed any xrays that were ordered on this patient: CT abdomen and pelvis shows no abnormalities  ____________________________________________   PROCEDURES  Procedure(s) performed: none  Critical Care performed: none  ____________________________________________   INITIAL IMPRESSION / ASSESSMENT AND PLAN / ED COURSE  Pertinent labs & imaging results that were available during my care of the patient were reviewed by me and considered in my medical decision making (see chart for details).  Patient overall well-appearing with some mild right lower quadrant tenderness to palpation. She has had nausea but no vomiting. Differential diagnosis includes kidney stone, urinary tract infection, appendicitis. We will treat with IV pain medications, check labs and reevaluate  Patient reports significant improvement in pain becomes to have some discomfort. We'll obtain CT abdomen and pelvis to evaluate further  CT unremarkable. The patient does report her pain is mostly improved. We will treat with outpatient pain Rx and antibiotics for early urinary tract infection. Return precautions discussed at length with patient  ____________________________________________   FINAL CLINICAL  IMPRESSION(S) / ED DIAGNOSES  Final diagnoses:  Right lower quadrant abdominal pain  UTI (lower urinary tract infection)     Jene Everyobert Sarahlynn Cisnero, MD 01/13/15 2129

## 2017-02-06 ENCOUNTER — Emergency Department
Admission: EM | Admit: 2017-02-06 | Discharge: 2017-02-06 | Disposition: A | Discharge status: Home / Self Care | Payer: Self-pay | Attending: Emergency Medicine | Admitting: Emergency Medicine

## 2017-02-06 ENCOUNTER — Other Ambulatory Visit: Payer: Self-pay

## 2017-02-06 DIAGNOSIS — I509 Heart failure, unspecified: Secondary | ICD-10-CM | POA: Insufficient documentation

## 2017-02-06 DIAGNOSIS — Z87891 Personal history of nicotine dependence: Secondary | ICD-10-CM | POA: Insufficient documentation

## 2017-02-06 DIAGNOSIS — Z79899 Other long term (current) drug therapy: Secondary | ICD-10-CM | POA: Insufficient documentation

## 2017-02-06 DIAGNOSIS — K047 Periapical abscess without sinus: Secondary | ICD-10-CM | POA: Insufficient documentation

## 2017-02-06 DIAGNOSIS — Z950 Presence of cardiac pacemaker: Secondary | ICD-10-CM | POA: Insufficient documentation

## 2017-02-06 MED ORDER — CLINDAMYCIN HCL 300 MG PO CAPS: 300.0000 mg | ORAL_CAPSULE | Freq: Three times a day (TID) | ORAL | 0 refills | Status: AC

## 2017-02-06 MED ORDER — KETOROLAC TROMETHAMINE 30 MG/ML IJ SOLN
30.0000 mg | Freq: Once | INTRAMUSCULAR | Status: AC
  Administered 2017-02-06: 30 mg via INTRAMUSCULAR
  Filled 2017-02-06: qty 1

## 2017-02-06 MED ORDER — KETOROLAC TROMETHAMINE 10 MG PO TABS
10.0000 mg | ORAL_TABLET | Freq: Four times a day (QID) | ORAL | 0 refills | Status: AC | PRN
Start: 2017-02-06 — End: 2017-02-11

## 2017-02-06 MED ORDER — CLINDAMYCIN HCL 150 MG PO CAPS
300.0000 mg | ORAL_CAPSULE | Freq: Once | ORAL | Status: AC
  Administered 2017-02-06: 300 mg via ORAL
  Filled 2017-02-06: qty 2

## 2017-02-06 NOTE — Discharge Instructions (Signed)
OPTIONS FOR DENTAL FOLLOW UP CARE ° °[place] Department of Health and Human Services - Local Safety Net Dental Clinics °http://www.ncdhhs.gov/dph/oralhealth/services/safetynetclinics.htm °  °[facility] (336-562-3123) ° °[facility] [place] (919-933-9087) ° °[facility] ([phone] ext [phone]) ° °[facility] ([phone]) ° °[facility] ([phone]) °This clinic caters to the indigent population and is on a lottery system. °Location: °[facility], [address], [place] °Clinic Hours: °Wednesdays from 6pm - 9pm, patients seen by a lottery system. °For dates, call or go to [email or web address].edu/[facility]/patients/[email or web address] °Services: °Cleanings, fillings and simple extractions. °Payment Options: °DENTAL WORK IS FREE OF CHARGE. Bring proof of income or support. °Best way to get seen: °Arrive at 5:15 pm - this is a lottery, NOT first come/first serve, so arriving earlier will not increase your chances of being seen. °  °  °[facility] °[phone] °Select option 1 for emergencies °  °Location: °[facility], [address], [place] °Clinic Hours: °No walk-ins accepted - call the day before to schedule an appointment. °Check in times are 9:30 am and 1:30 pm. °Services: °Simple extractions, temporary fillings, pulpectomy/pulp debridement, uncomplicated abscess drainage. °Payment Options: °PAYMENT IS DUE AT THE TIME OF SERVICE.  Fee is usually $100-200, additional surgical procedures (e.g. abscess drainage) may be extra. °Cash, checks, Visa/MasterCard accepted.  Can file Medicaid if patient is covered for dental - patient should call case worker to check. °No discount for [facility] patients. °Best way to get seen: °MUST call the day before and get onto the schedule. Can usually be seen the next 1-2 days. No walk-ins accepted. °  °  °[facility] °[phone] °   °Location: °[facility], [address], [place] °Clinic Hours: °M, W, Th, F 8am or 1:30pm, Tues 9a or 1:30 - first come/first served. °Services: °Simple extractions, temporary fillings, uncomplicated abscess drainage.  You do not need to be an [place] resident. °Payment Options: °PAYMENT IS DUE AT THE TIME OF SERVICE. °Dental insurance, otherwise sliding scale - bring proof of income or support. °Depending on income and treatment needed, cost is usually $50-200. °Best way to get seen: °Arrive early as it is first come/first served. °  °  °[facility] °[phone] °  °Location: °[address] °Clinic Hours: °Mon-Thu 8a-5p °Services: °Most basic dental services including extractions and fillings. °Payment Options: °PAYMENT IS DUE AT THE TIME OF SERVICE. °Sliding scale, up to 50% off - bring proof if income or support. °Medicaid with dental option accepted. °Best way to get seen: °Call to schedule an appointment, can usually be seen within 2 weeks OR they will try to see walk-ins - show up at 8a or 2p (you may have to wait). °  °  °[facility] °[phone] °[place] RESIDENTS ONLY °  °Location: °[facility], [address], [place], [place] [address] °Clinic Hours: By appointment only. °Monday - Thursday 8am-5pm, Friday 8am-12pm °Services: Cleanings, fillings, extractions. °Payment Options: °PAYMENT IS DUE AT THE TIME OF SERVICE. °Cash, Visa or MasterCard. Sliding scale - $30 minimum per service. °Best way to get seen: °Come in to office, complete packet and make an appointment - need proof of income °or support monies for each household member and proof of [place] residence. °Usually takes about a month to get in. °  °  °[facility] °[phone] °  °Location: °[address]   [place] °Clinic Hours: Walk-in Urgent Care Dental Services are offered Monday-Friday  mornings only. °The numbers of emergencies accepted daily is limited to the number of °providers available. °Maximum 15 - Mondays, Wednesdays & Thursdays °Maximum 10 - Tuesdays & Fridays °Services: °You do not need to be a [place] resident to be seen for a dental emergency. °Emergencies are defined as pain, swelling, abnormal bleeding, or dental trauma. Walkins will receive x-rays if needed. °NOTE: Dental cleaning is not an emergency. °Payment Options: °PAYMENT IS DUE AT THE TIME OF SERVICE. °Minimum co-pay is $40.00 for uninsured patients. °Minimum co-pay is $3.00 for Medicaid with dental coverage. °Dental Insurance is accepted and must be presented at time of visit. °Medicare does not cover dental. °Forms of payment: Cash, credit card, checks. °Best way to get seen: °If not previously registered with the clinic, walk-in dental registration begins at 7:15 am and is on a first come/first serve basis. °If previously registered with the clinic, call to make an appointment. °  °  °[facility] °[phone] °[place] RESIDENTS ONLY °  °Location: °[address], [place], [place] °Clinic Hours: °Mon-Thu 10a-2p °Services: Extractions only! °Payment Options: °FREE (donations accepted) - bring proof of income or support °Best way to get seen: °Call and schedule an appointment OR come at 8am on the 1st Monday of every month (except for holidays) when it is first come/first served. °  °  °[facility] °[phone] °  °Location: °[address], [place] °Clinic Hours: °Friday mornings °Services, Payment Options, Best way to get seen: °Call for info °

## 2017-02-06 NOTE — ED Triage Notes (Signed)
Pt arrives to ED via POV from home with c/o dental pain x1 week. Pt reports LEFT lower dental pain. No swelling noted; pt reports taking Ibuprofen around 4pm today without relief.

## 2017-02-06 NOTE — ED Notes (Signed)
Pt states she has had this pain for a week, and she has has intermittent neck pain going up into left ear and has started having headaches.  Pt states she does not have any insurance for dental work.

## 2017-02-06 NOTE — ED Provider Notes (Signed)
[facility] Emergency Department Provider Note  ____________________________________________  Time seen: Approximately 8:38 PM  I have reviewed the triage vital signs and the nursing notes.   HISTORY  Chief Complaint Dental Pain    HPI Tina NaegeliSamantha Mora is a 31 y.o. female presents to the emergency department with inferior 19 dental pain.  Patient reports that inferior 19 broke several years ago.  Patient has been unable to secure dental care due to cost.  She has had fever and some chills and pain is becoming intolerable to her.  No alleviating measures have been attempted.   Past Medical History:  Diagnosis Date  . CHF (congestive heart failure) (HCC)   . Pacemaker     There are no active problems to display for this patient.   Past Surgical History:  Procedure Laterality Date  . CARDIAC CATHETERIZATION    . PACEMAKER PLACEMENT      Prior to Admission medications   Medication Sig Start Date End Date Taking? Authorizing Provider  ciprofloxacin (CIPRO) 500 MG tablet Take 1 tablet (500 mg total) by mouth 2 (two) times daily. 01/13/15   Jene EveryKinner, Robert, MD  clindamycin (CLEOCIN) 300 MG capsule Take 1 capsule (300 mg total) 3 (three) times daily for 10 days by mouth. 02/06/17 02/16/17  Orvil FeilWoods, Deicy Rusk M, PA-C  furosemide (LASIX) 20 MG tablet Take 20 mg by mouth daily. 12/22/14   [provider]  ketorolac (TORADOL) 10 MG tablet Take 1 tablet (10 mg total) every 6 (six) hours as needed for up to 5 days by mouth. 02/06/17 02/11/17  Orvil FeilWoods, Besan Ketchem M, PA-C  ondansetron (ZOFRAN-ODT) 4 MG disintegrating tablet Take 4 mg by mouth every 8 (eight) hours as needed. For nausea. 11/22/14   [provider]    Allergies Augmentin [amoxicillin-pot clavulanate]  No family history on file.  Social History Social History   Tobacco Use  . Smoking status: Former Games developermoker  . Smokeless tobacco: Never Used  Substance Use Topics  . Alcohol use: No  . Drug  use: No     Review of Systems  Constitutional: No fever/chills Eyes: No visual changes. No discharge ENT: Patient has dental pain.  Cardiovascular: no chest pain. Respiratory: no cough. No SOB. Gastrointestinal: No abdominal pain.  No nausea, no vomiting.  No diarrhea.  No constipation. Musculoskeletal: Negative for musculoskeletal pain. Skin: Negative for rash, abrasions, lacerations, ecchymosis. Neurological: Negative for headaches, focal weakness or numbness.   ____________________________________________   PHYSICAL EXAM:  VITAL SIGNS: ED Triage Vitals [02/06/17 1908]  Enc Vitals Group     BP (!) 150/92     Pulse Rate 71     Resp 18     Temp 98.7 F (37.1 C)     Temp Source Oral     SpO2 100 %     Weight 164 lb (74.4 kg)     Height 4\' 11"  (1.499 m)     Head Circumference      Peak Flow      Pain Score 10     Pain Loc      Pain Edu?      Excl. in GC?      Constitutional: Alert and oriented. Well appearing and in no acute distress. Eyes: Conjunctivae are normal. PERRL. EOMI. Head: Atraumatic. ENT:      Ears: TMs are pearly bilaterally.       Nose: No congestion/rhinnorhea.      Mouth/Throat: Mucous membranes are moist. Patient has broken Inferior 19.  Neck:  No stridor. No cervical spine tenderness to palpation. Cardiovascular: Normal rate, regular rhythm. Normal S1 and S2.  Good peripheral circulation. Respiratory: Normal respiratory effort without tachypnea or retractions. Lungs CTAB. Good air entry to the bases with no decreased or absent breath sounds. Musculoskeletal: Full range of motion to all extremities. No gross deformities appreciated. Neurologic:  Normal speech and language. No gross focal neurologic deficits are appreciated.  Skin:  Skin is warm, dry and intact. No rash noted. Psychiatric: Mood and affect are normal. Speech and behavior are normal. Patient exhibits appropriate insight and  judgement.   ____________________________________________   LABS (all labs ordered are listed, but only abnormal results are displayed)  Labs Reviewed - No data to display ____________________________________________  EKG   ____________________________________________  RADIOLOGY   No results found.  ____________________________________________    PROCEDURES  Procedure(s) performed:    Procedures    Medications  ketorolac (TORADOL) 30 MG/ML injection 30 mg (not administered)     ____________________________________________   INITIAL IMPRESSION / ASSESSMENT AND PLAN / ED COURSE  Pertinent labs & imaging results that were available during my care of the patient were reviewed by me and considered in my medical decision making (see chart for details).  Review of the [place] CSRS was performed in accordance of the [facility] prior to dispensing any controlled drugs.     Assessment and plan Dental pain Patient presents to the emergency department with dental pain.  Patient was given an injection of Toradol in the emergency department and she was discharged with Toradol.  Patient was started on clindamycin due to severe allergic reaction with Augmentin and amoxicillin.  Patient was advised to make an appointment with a dentist as soon as possible.  Vital signs are reassuring prior to discharge.  All patient questions were answered.    ____________________________________________  FINAL CLINICAL IMPRESSION(S) / ED DIAGNOSES  Final diagnoses:  Dental abscess      NEW MEDICATIONS STARTED DURING THIS VISIT:  ED Discharge Orders        Ordered    ketorolac (TORADOL) 10 MG tablet  Every 6 hours PRN     02/06/17 2032    clindamycin (CLEOCIN) 300 MG capsule  3 times daily     02/06/17 2032          This chart was dictated using voice recognition software/Dragon. Despite best efforts to proofread, errors can occur which can change the meaning. Any change was  purely unintentional.    Orvil FeilWoods, Dione Mccombie M, PA-C 02/06/17 2042    Arnaldo NatalMalinda, Paul F, MD 02/06/17 [phone]

## 2017-05-10 ENCOUNTER — Emergency Department
Admission: EM | Admit: 2017-05-10 | Discharge: 2017-05-10 | Disposition: A | Discharge status: Home / Self Care | Payer: Self-pay | Attending: Emergency Medicine | Admitting: Emergency Medicine

## 2017-05-10 ENCOUNTER — Encounter: Payer: Self-pay | Admitting: Emergency Medicine

## 2017-05-10 ENCOUNTER — Other Ambulatory Visit: Payer: Self-pay

## 2017-05-10 DIAGNOSIS — Z950 Presence of cardiac pacemaker: Secondary | ICD-10-CM | POA: Insufficient documentation

## 2017-05-10 DIAGNOSIS — B9789 Other viral agents as the cause of diseases classified elsewhere: Secondary | ICD-10-CM | POA: Insufficient documentation

## 2017-05-10 DIAGNOSIS — Z87891 Personal history of nicotine dependence: Secondary | ICD-10-CM | POA: Insufficient documentation

## 2017-05-10 DIAGNOSIS — I509 Heart failure, unspecified: Secondary | ICD-10-CM | POA: Insufficient documentation

## 2017-05-10 DIAGNOSIS — J069 Acute upper respiratory infection, unspecified: Secondary | ICD-10-CM | POA: Insufficient documentation

## 2017-05-10 LAB — GROUP A STREP BY PCR: GROUP A STREP BY PCR: NOT DETECTED

## 2017-05-10 MED ORDER — ACETAMINOPHEN 500 MG PO TABS
1000.0000 mg | ORAL_TABLET | Freq: Once | ORAL | Status: AC
  Administered 2017-05-10: 1000 mg via ORAL
  Filled 2017-05-10: qty 2

## 2017-05-10 MED ORDER — GUAIFENESIN-CODEINE 100-10 MG/5ML PO SOLN: 5.0000 mL | Freq: Four times a day (QID) | ORAL | 0 refills | Status: AC | PRN

## 2017-05-10 NOTE — ED Triage Notes (Signed)
Pt in via POV with complaints of fever, generalized body aches, left ear pain x approximately one week.  Pt afebrile upon arrival, NAD noted at this time.

## 2017-05-10 NOTE — ED Provider Notes (Signed)
[facility] Emergency Department Provider Note   ____________________________________________   First MD Initiated Contact with Patient 05/10/17 1320     (approximate)  I have reviewed the triage vital signs and the nursing notes.   HISTORY  Chief Complaint URI   HPI Tina Mora is a 32 y.o. female is here with complaint of fever, body aches, left ear pain for approximately 1 week.  Patient also states that she has had some throat pain and was exposed to strep throat recently.  She denies any nausea, vomiting, or diarrhea.  She has taken over-the-counter medication without any relief of her symptoms.   Past Medical History:  Diagnosis Date  . CHF (congestive heart failure) (HCC)   . Pacemaker     There are no active problems to display for this patient.   Past Surgical History:  Procedure Laterality Date  . CARDIAC CATHETERIZATION    . PACEMAKER PLACEMENT      Prior to Admission medications   Medication Sig Start Date End Date Taking? Authorizing Provider  guaiFENesin-codeine 100-10 MG/5ML syrup Take 5 mLs by mouth every 6 (six) hours as needed for cough. 05/10/17   Tommi Rumps, PA-C  ondansetron (ZOFRAN-ODT) 4 MG disintegrating tablet Take 4 mg by mouth every 8 (eight) hours as needed. For nausea. 11/22/14   [provider]    Allergies Augmentin [amoxicillin-pot clavulanate]  No family history on file.  Social History Social History   Tobacco Use  . Smoking status: Former Games developer  . Smokeless tobacco: Never Used  Substance Use Topics  . Alcohol use: No  . Drug use: Yes    Types: Marijuana    Review of Systems Constitutional: Positive fever/chills Eyes: No visual changes. ENT: Positive sore throat.  Positive left ear pain. Cardiovascular: Denies chest pain. Respiratory: Denies shortness of breath. Gastrointestinal: No abdominal pain.  No nausea, no vomiting.  No diarrhea. Musculoskeletal: Positive for body  aches. Skin: Negative for rash. Neurological: Negative for headaches, focal weakness or numbness. ____________________________________________   PHYSICAL EXAM:  VITAL SIGNS: ED Triage Vitals  Enc Vitals Group     BP 05/10/17 1233 (!) 146/101     Pulse Rate 05/10/17 1233 92     Resp 05/10/17 1233 18     Temp 05/10/17 1233 98.8 F (37.1 C)     Temp Source 05/10/17 1233 Oral     SpO2 05/10/17 1233 98 %     Weight 05/10/17 1233 158 lb (71.7 kg)     Height 05/10/17 1233 4\' 11"  (1.499 m)     Head Circumference --      Peak Flow --      Pain Score 05/10/17 1235 10     Pain Loc --      Pain Edu? --      Excl. in GC? --    Constitutional: Alert and oriented. Well appearing and in no acute distress. Eyes: Conjunctivae are normal.  Head: Atraumatic. Nose: Mild congestion/rhinnorhea.  TMs are dull bilaterally without erythema or injection.  Poor light reflex is noted. Mouth/Throat: Mucous membranes are moist.  Oropharynx mild erythema present with posterior drainage.  No exudate is noted.  Uvula is midline. Neck: No stridor.   Hematological/Lymphatic/Immunilogical: Tender bilateral cervical lymphadenopathy. Cardiovascular: Grade 2/6 systolic murmur heard throughout.  Regular rate and rhythm.  Good peripheral circulation. Respiratory: Normal respiratory effort.  No retractions. Lungs CTAB. Gastrointestinal: Soft and nontender. No distention.  No CVA tenderness. Musculoskeletal: Moves upper and lower extremities without  any difficulty.  Normal gait was noted. Neurologic:  Normal speech and language. No gross focal neurologic deficits are appreciated.  Skin:  Skin is warm, dry and intact. No rash noted. Psychiatric: Mood and affect are normal. Speech and behavior are normal.  ____________________________________________   LABS (all labs ordered are listed, but only abnormal results are displayed)  Labs Reviewed  GROUP A STREP BY PCR     PROCEDURES  Procedure(s) performed:  None  Procedures  Critical Care performed: No  ____________________________________________   INITIAL IMPRESSION / ASSESSMENT AND PLAN / ED COURSE Patient was made aware that her strep test was negative.  She is aware that this most likely is a viral illness and should run its course.  She is encouraged to take Tylenol or ibuprofen as needed for body aches or fever.  She was given a prescription for Robitussin-AC as needed for cough and congestion.  She is to follow-up with her PCP for recheck of her blood pressure.  Today in the ED blood pressure was elevated.  Patient currently does not take Lasix as she has in the past.  Patient was discharged in stable condition and was ambulatory at that time.  ____________________________________________   FINAL CLINICAL IMPRESSION(S) / ED DIAGNOSES  Final diagnoses:  Viral URI with cough     ED Discharge Orders        Ordered    guaiFENesin-codeine 100-10 MG/5ML syrup  Every 6 hours PRN     05/10/17 1446       Note:  This document was prepared using Dragon voice recognition software and may include unintentional dictation errors.    Tommi RumpsSummers, Numa Heatwole L, PA-C 05/10/17 1503    Arnaldo NatalMalinda, Paul F, MD 05/10/17 [phone]

## 2017-05-10 NOTE — ED Notes (Signed)

## 2017-05-10 NOTE — Discharge Instructions (Signed)
Follow-up with your primary care provider if any continued problems.  Increase fluids.  Take Tylenol or ibuprofen as needed for fever or body aches.  Robitussin AC as needed for cough only as directed.  Do not drive while taking this medication as it does contain a narcotic.

## 2018-04-16 ENCOUNTER — Emergency Department: Payer: Self-pay

## 2018-04-16 ENCOUNTER — Emergency Department
Admission: EM | Admit: 2018-04-16 | Discharge: 2018-04-16 | Disposition: A | Discharge status: Home / Self Care | Payer: Self-pay | Attending: Emergency Medicine | Admitting: Emergency Medicine

## 2018-04-16 ENCOUNTER — Encounter: Payer: Self-pay | Admitting: Emergency Medicine

## 2018-04-16 ENCOUNTER — Other Ambulatory Visit: Payer: Self-pay

## 2018-04-16 DIAGNOSIS — Z87891 Personal history of nicotine dependence: Secondary | ICD-10-CM | POA: Insufficient documentation

## 2018-04-16 DIAGNOSIS — N12 Tubulo-interstitial nephritis, not specified as acute or chronic: Secondary | ICD-10-CM

## 2018-04-16 DIAGNOSIS — N10 Acute tubulo-interstitial nephritis: Secondary | ICD-10-CM | POA: Insufficient documentation

## 2018-04-16 DIAGNOSIS — Z950 Presence of cardiac pacemaker: Secondary | ICD-10-CM | POA: Insufficient documentation

## 2018-04-16 DIAGNOSIS — Z79899 Other long term (current) drug therapy: Secondary | ICD-10-CM | POA: Insufficient documentation

## 2018-04-16 DIAGNOSIS — I509 Heart failure, unspecified: Secondary | ICD-10-CM | POA: Insufficient documentation

## 2018-04-16 LAB — CBC
HCT: 40.2 % (ref 36.0–46.0)
Hemoglobin: 12.8 g/dL (ref 12.0–15.0)
MCH: 28.1 pg (ref 26.0–34.0)
MCHC: 31.8 g/dL (ref 30.0–36.0)
MCV: 88.4 fL (ref 80.0–100.0)
PLATELETS: 203 10*3/uL (ref 150–400)
RBC: 4.55 MIL/uL (ref 3.87–5.11)
RDW: 13.3 % (ref 11.5–15.5)
WBC: 9.9 10*3/uL (ref 4.0–10.5)
nRBC: 0 % (ref 0.0–0.2)

## 2018-04-16 LAB — URINALYSIS, COMPLETE (UACMP) WITH MICROSCOPIC
BILIRUBIN URINE: NEGATIVE
GLUCOSE, UA: NEGATIVE mg/dL
KETONES UR: NEGATIVE mg/dL
Nitrite: NEGATIVE
PROTEIN: NEGATIVE mg/dL
Specific Gravity, Urine: 1.015 (ref 1.005–1.030)
pH: 7 (ref 5.0–8.0)

## 2018-04-16 LAB — BASIC METABOLIC PANEL
ANION GAP: 4 — AB (ref 5–15)
BUN: 5 mg/dL — ABNORMAL LOW (ref 6–20)
CALCIUM: 9.3 mg/dL (ref 8.9–10.3)
CO2: 25 mmol/L (ref 22–32)
CREATININE: 0.71 mg/dL (ref 0.44–1.00)
Chloride: 108 mmol/L (ref 98–111)
GLUCOSE: 100 mg/dL — AB (ref 70–99)
Potassium: 3.7 mmol/L (ref 3.5–5.1)
Sodium: 137 mmol/L (ref 135–145)

## 2018-04-16 LAB — PREGNANCY, URINE: PREG TEST UR: NEGATIVE

## 2018-04-16 MED ORDER — CIPROFLOXACIN HCL 500 MG PO TABS: 500.0000 mg | ORAL_TABLET | Freq: Two times a day (BID) | ORAL | 0 refills | Status: AC

## 2018-04-16 MED ORDER — SODIUM CHLORIDE 0.9 % IV SOLN
1.0000 g | Freq: Once | INTRAVENOUS | Status: AC
  Administered 2018-04-16: 1 g via INTRAVENOUS
  Filled 2018-04-16: qty 10

## 2018-04-16 MED ORDER — ONDANSETRON 4 MG PO TBDP: 4.0000 mg | ORAL_TABLET | Freq: Three times a day (TID) | ORAL | 0 refills | Status: DC | PRN

## 2018-04-16 MED ORDER — NAPROXEN 500 MG PO TABS: 500.0000 mg | ORAL_TABLET | Freq: Two times a day (BID) | ORAL | 2 refills | Status: AC

## 2018-04-16 MED ORDER — ONDANSETRON HCL 4 MG/2ML IJ SOLN
4.0000 mg | Freq: Once | INTRAMUSCULAR | Status: AC
  Administered 2018-04-16: 4 mg via INTRAVENOUS
  Filled 2018-04-16: qty 2

## 2018-04-16 MED ORDER — IBUPROFEN 600 MG PO TABS
600.0000 mg | ORAL_TABLET | Freq: Once | ORAL | Status: AC
  Administered 2018-04-16: 600 mg via ORAL
  Filled 2018-04-16: qty 1

## 2018-04-16 MED ORDER — SODIUM CHLORIDE 0.9 % IV SOLN
1000.0000 mL | Freq: Once | INTRAVENOUS | Status: AC
  Administered 2018-04-16: 1000 mL via INTRAVENOUS

## 2018-04-16 NOTE — ED Triage Notes (Signed)
Here for left flank pain X 2 days with tmax 100.9. afebrile today. No tylenol or motrin.  Unlabored.  No hematuria. No history of kidney stones.  No pyelonephritis hx.  Has had vomiting. No vomiting at this time. Color WNL. Ambulatory.

## 2018-04-16 NOTE — ED Notes (Signed)
EDP at bedside. Pt given warm blankets. Pt c/o L flank pain x 2 days that has progressively worsened to be painful with movement. Pt is alert and oriented x 4, skin warm, dry, and intact. NAD noted at this time.

## 2018-04-16 NOTE — ED Provider Notes (Signed)
[facility] Emergency Department Provider Note   ____________________________________________    I have reviewed the triage vital signs and the nursing notes.   HISTORY  Chief Complaint Flank Pain     HPI Tina Mora is a 33 y.o. female with a history of congestive heart failure with a pacemaker presents with complaints of left-sided flank pain.  Patient reports this is developed over the last 2 to 3 days and has worsened significantly over the last 24 hours.  She reports the pain is sharp in nature and does not radiate.  She denies dysuria.  Has had chills, think she has had fevers as well.  Has not taken anything for this.  Positive nausea, no vomiting  Past Medical History:  Diagnosis Date  . CHF (congestive heart failure) (HCC)   . Pacemaker     There are no active problems to display for this patient.   Past Surgical History:  Procedure Laterality Date  . CARDIAC CATHETERIZATION    . PACEMAKER PLACEMENT      Prior to Admission medications   Medication Sig Start Date End Date Taking? Authorizing Provider  ciprofloxacin (CIPRO) 500 MG tablet Take 1 tablet (500 mg total) by mouth 2 (two) times daily. 04/16/18   Jene Every, MD  guaiFENesin-codeine 100-10 MG/5ML syrup Take 5 mLs by mouth every 6 (six) hours as needed for cough. 05/10/17   Tommi Rumps, PA-C  naproxen (NAPROSYN) 500 MG tablet Take 1 tablet (500 mg total) by mouth 2 (two) times daily with a meal. 04/16/18   Jene Every, MD  ondansetron (ZOFRAN ODT) 4 MG disintegrating tablet Take 1 tablet (4 mg total) by mouth every 8 (eight) hours as needed for nausea or vomiting. 04/16/18   Jene Every, MD     Allergies Augmentin [amoxicillin-pot clavulanate]  History reviewed. No pertinent family history.  Social History Social History   Tobacco Use  . Smoking status: Former Games developer  . Smokeless tobacco: Never Used  Substance Use Topics  . Alcohol use: No  . Drug  use: Yes    Types: Marijuana    Review of Systems  Constitutional: Positive fever Eyes: No visual changes.  ENT: No sore throat. Cardiovascular: Denies chest pain. Respiratory: Denies shortness of breath. Gastrointestinal: No abdominal pain.   Genitourinary: left flank pain Musculoskeletal: Negative for back pain. Skin: Negative for rash. Neurological: Negative for headaches    ____________________________________________   PHYSICAL EXAM:  VITAL SIGNS: ED Triage Vitals  Enc Vitals Group     BP 04/16/18 1055 (!) 133/104     Pulse Rate 04/16/18 1055 72     Resp 04/16/18 1055 18     Temp 04/16/18 1055 98.7 F (37.1 C)     Temp Source 04/16/18 1055 Oral     SpO2 04/16/18 1055 99 %     Weight 04/16/18 1053 68.9 kg (152 lb)     Height 04/16/18 1053 1.499 m (4\' 11" )     Head Circumference --      Peak Flow --      Pain Score 04/16/18 1053 10     Pain Loc --      Pain Edu? --      Excl. in GC? --     Constitutional: Alert and oriented. No acute distress.   Nose: No congestion/rhinnorhea. Mouth/Throat: Mucous membranes are moist.    Cardiovascular: Normal rate, regular rhythm. Grossly normal heart sounds.  Good peripheral circulation. Respiratory: Normal respiratory effort.  No retractions.  Lungs CTAB. Gastrointestinal: Soft and nontender. No distention.  Left-sided CVA tenderness  Musculoskeletal:   Warm and well perfused Neurologic:  Normal speech and language. No gross focal neurologic deficits are appreciated.  Skin:  Skin is warm, dry and intact. No rash noted. Psychiatric: Mood and affect are normal. Speech and behavior are normal.  ____________________________________________   LABS (all labs ordered are listed, but only abnormal results are displayed)  Labs Reviewed  URINALYSIS, COMPLETE (UACMP) WITH MICROSCOPIC - Abnormal; Notable for the following components:      Result Value   Color, Urine YELLOW (*)    APPearance HAZY (*)    Hgb urine dipstick  SMALL (*)    Leukocytes, UA MODERATE (*)    Bacteria, UA RARE (*)    All other components within normal limits  BASIC METABOLIC PANEL - Abnormal; Notable for the following components:   Glucose, Bld 100 (*)    BUN 5 (*)    Anion gap 4 (*)    All other components within normal limits  CBC  PREGNANCY, URINE   ____________________________________________  EKG  None ____________________________________________  RADIOLOGY  No kidney stone on CT ____________________________________________   PROCEDURES  Procedure(s) performed: No  Procedures   Critical Care performed: No ____________________________________________   INITIAL IMPRESSION / ASSESSMENT AND PLAN / ED COURSE  Pertinent labs & imaging results that were available during my care of the patient were reviewed by me and considered in my medical decision making (see chart for details).  Patient presents with left flank pain, urinalysis consistent for UTI/pyelonephritis.  Will give IV Rocephin and obtain CT to rule out kidney stone.  CT negative for kidney stone, discussed adnexal cyst with the patient.  She is tolerating p.o.'s, appropriate for outpatient treatment, we discussed return precautions    ____________________________________________   FINAL CLINICAL IMPRESSION(S) / ED DIAGNOSES  Final diagnoses:  Pyelonephritis        Note:  This document was prepared using Dragon voice recognition software and may include unintentional dictation errors.   Jene EveryKinner, Laverne Klugh, MD 04/16/18 1504

## 2018-05-30 ENCOUNTER — Emergency Department: Payer: Medicaid Other

## 2018-05-30 ENCOUNTER — Emergency Department
Admission: EM | Admit: 2018-05-30 | Discharge: 2018-05-30 | Disposition: A | Discharge status: Home / Self Care | Payer: Medicaid Other | Attending: Emergency Medicine | Admitting: Emergency Medicine

## 2018-05-30 ENCOUNTER — Other Ambulatory Visit: Payer: Self-pay

## 2018-05-30 ENCOUNTER — Encounter: Payer: Self-pay | Admitting: *Deleted

## 2018-05-30 DIAGNOSIS — Z87891 Personal history of nicotine dependence: Secondary | ICD-10-CM | POA: Insufficient documentation

## 2018-05-30 DIAGNOSIS — I509 Heart failure, unspecified: Secondary | ICD-10-CM | POA: Insufficient documentation

## 2018-05-30 DIAGNOSIS — J09X2 Influenza due to identified novel influenza A virus with other respiratory manifestations: Secondary | ICD-10-CM | POA: Insufficient documentation

## 2018-05-30 DIAGNOSIS — Z950 Presence of cardiac pacemaker: Secondary | ICD-10-CM | POA: Insufficient documentation

## 2018-05-30 DIAGNOSIS — K2900 Acute gastritis without bleeding: Secondary | ICD-10-CM

## 2018-05-30 DIAGNOSIS — J101 Influenza due to other identified influenza virus with other respiratory manifestations: Secondary | ICD-10-CM

## 2018-05-30 DIAGNOSIS — Z79899 Other long term (current) drug therapy: Secondary | ICD-10-CM | POA: Insufficient documentation

## 2018-05-30 LAB — COMPREHENSIVE METABOLIC PANEL
ALT: 11 U/L (ref 0–44)
AST: 18 U/L (ref 15–41)
Albumin: 4.6 g/dL (ref 3.5–5.0)
Alkaline Phosphatase: 67 U/L (ref 38–126)
Anion gap: 8 (ref 5–15)
BUN: 7 mg/dL (ref 6–20)
CHLORIDE: 110 mmol/L (ref 98–111)
CO2: 23 mmol/L (ref 22–32)
Calcium: 9.2 mg/dL (ref 8.9–10.3)
Creatinine, Ser: 0.76 mg/dL (ref 0.44–1.00)
Glucose, Bld: 106 mg/dL — ABNORMAL HIGH (ref 70–99)
POTASSIUM: 2.9 mmol/L — AB (ref 3.5–5.1)
SODIUM: 141 mmol/L (ref 135–145)
Total Bilirubin: 0.5 mg/dL (ref 0.3–1.2)
Total Protein: 8 g/dL (ref 6.5–8.1)

## 2018-05-30 LAB — CBC WITH DIFFERENTIAL/PLATELET
ABS IMMATURE GRANULOCYTES: 0.01 10*3/uL (ref 0.00–0.07)
BASOS PCT: 0 %
Basophils Absolute: 0 10*3/uL (ref 0.0–0.1)
EOS ABS: 0 10*3/uL (ref 0.0–0.5)
Eosinophils Relative: 0 %
HCT: 39.8 % (ref 36.0–46.0)
Hemoglobin: 12.7 g/dL (ref 12.0–15.0)
IMMATURE GRANULOCYTES: 0 %
Lymphocytes Relative: 14 %
Lymphs Abs: 0.7 10*3/uL (ref 0.7–4.0)
MCH: 28.3 pg (ref 26.0–34.0)
MCHC: 31.9 g/dL (ref 30.0–36.0)
MCV: 88.8 fL (ref 80.0–100.0)
MONOS PCT: 8 %
Monocytes Absolute: 0.4 10*3/uL (ref 0.1–1.0)
NEUTROS ABS: 3.8 10*3/uL (ref 1.7–7.7)
NEUTROS PCT: 78 %
PLATELETS: 208 10*3/uL (ref 150–400)
RBC: 4.48 MIL/uL (ref 3.87–5.11)
RDW: 13.5 % (ref 11.5–15.5)
WBC: 4.9 10*3/uL (ref 4.0–10.5)
nRBC: 0 % (ref 0.0–0.2)

## 2018-05-30 LAB — POCT PREGNANCY, URINE: Preg Test, Ur: NEGATIVE

## 2018-05-30 LAB — URINALYSIS, COMPLETE (UACMP) WITH MICROSCOPIC
Bilirubin Urine: NEGATIVE
GLUCOSE, UA: NEGATIVE mg/dL
Ketones, ur: 5 mg/dL — AB
NITRITE: NEGATIVE
PH: 5 (ref 5.0–8.0)
PROTEIN: 100 mg/dL — AB
SPECIFIC GRAVITY, URINE: 1.034 — AB (ref 1.005–1.030)

## 2018-05-30 LAB — INFLUENZA PANEL BY PCR (TYPE A & B)
Influenza A By PCR: POSITIVE — AB
Influenza B By PCR: NEGATIVE

## 2018-05-30 LAB — TROPONIN I

## 2018-05-30 LAB — BRAIN NATRIURETIC PEPTIDE: B NATRIURETIC PEPTIDE 5: 501 pg/mL — AB (ref 0.0–100.0)

## 2018-05-30 MED ORDER — ONDANSETRON 4 MG PO TBDP: 4.0000 mg | ORAL_TABLET | Freq: Three times a day (TID) | ORAL | 0 refills | Status: AC | PRN

## 2018-05-30 MED ORDER — KETOROLAC TROMETHAMINE 30 MG/ML IJ SOLN
15.0000 mg | INTRAMUSCULAR | Status: AC
  Administered 2018-05-30: 15 mg via INTRAVENOUS
  Filled 2018-05-30: qty 1

## 2018-05-30 MED ORDER — FAMOTIDINE 20 MG PO TABS: 20.0000 mg | ORAL_TABLET | Freq: Two times a day (BID) | ORAL | 0 refills | Status: AC

## 2018-05-30 MED ORDER — OSELTAMIVIR PHOSPHATE 75 MG PO CAPS
75.0000 mg | ORAL_CAPSULE | Freq: Once | ORAL | Status: AC
  Administered 2018-05-30: 75 mg via ORAL
  Filled 2018-05-30: qty 1

## 2018-05-30 MED ORDER — DEXAMETHASONE SODIUM PHOSPHATE 10 MG/ML IJ SOLN
10.0000 mg | Freq: Once | INTRAMUSCULAR | Status: AC
  Administered 2018-05-30: 10 mg via INTRAVENOUS
  Filled 2018-05-30: qty 1

## 2018-05-30 MED ORDER — SODIUM CHLORIDE 0.9 % IV BOLUS
1000.0000 mL | Freq: Once | INTRAVENOUS | Status: AC
  Administered 2018-05-30: 1000 mL via INTRAVENOUS

## 2018-05-30 MED ORDER — OSELTAMIVIR PHOSPHATE 75 MG PO CAPS: 75.0000 mg | ORAL_CAPSULE | Freq: Two times a day (BID) | ORAL | 0 refills | Status: AC

## 2018-05-30 MED ORDER — ALBUTEROL SULFATE (2.5 MG/3ML) 0.083% IN NEBU
5.0000 mg | INHALATION_SOLUTION | Freq: Once | RESPIRATORY_TRACT | Status: AC
  Administered 2018-05-30: 5 mg via RESPIRATORY_TRACT
  Filled 2018-05-30: qty 6

## 2018-05-30 MED ORDER — ONDANSETRON HCL 4 MG/2ML IJ SOLN
4.0000 mg | Freq: Once | INTRAMUSCULAR | Status: AC
  Administered 2018-05-30: 4 mg via INTRAVENOUS
  Filled 2018-05-30: qty 2

## 2018-05-30 NOTE — ED Provider Notes (Signed)
[facility] Emergency Department Provider Note  ____________________________________________  Time seen: Approximately 8:08 PM  I have reviewed the triage vital signs and the nursing notes.   HISTORY  Chief Complaint Emesis and Generalized Body Aches    HPI Tina Mora is a 33 y.o. female with a history of congenital heart defect, CHF, pacemaker insertion remotely who complains of nausea vomiting fever body aches for the past 2 days.  She also reports feeling malaise and lightheaded since last night.  Difficulty eating or drinking anything but she has been able to take some sips of Gatorade today.  Also generalized headache.  Symptoms are constant, waxing and waning, no aggravating or alleviating factors.  No focal pain or radiating pain.      Past Medical History:  Diagnosis Date  . CHF (congestive heart failure) (HCC)   . Pacemaker      There are no active problems to display for this patient.    Past Surgical History:  Procedure Laterality Date  . CARDIAC CATHETERIZATION    . PACEMAKER PLACEMENT       Prior to Admission medications   Medication Sig Start Date End Date Taking? Authorizing Provider  fluocinonide (LIDEX) 0.05 % external solution Apply 1 application topically daily. 07/24/17  Yes [provider]  ketoconazole (NIZORAL) 2 % cream Apply 1 application topically 3 (three) times a week. 07/24/17  Yes [provider]  acetaminophen (TYLENOL) 500 MG tablet Take 1,000 mg by mouth every 8 (eight) hours as needed for pain.    [provider]  ciprofloxacin (CIPRO) 500 MG tablet Take 1 tablet (500 mg total) by mouth 2 (two) times daily. 04/16/18   Jene EveryKinner, Robert, MD  famotidine (PEPCID) 20 MG tablet Take 1 tablet (20 mg total) by mouth 2 (two) times daily. 05/30/18   Sharman CheekStafford, Baylyn Sickles, MD  guaiFENesin-codeine 100-10 MG/5ML syrup Take 5 mLs by mouth every 6 (six) hours as needed for cough. 05/10/17   Tommi RumpsSummers, Rhonda L,  PA-C  ibuprofen (ADVIL,MOTRIN) 200 MG tablet Take 800 mg by mouth every 6 (six) hours as needed for pain.    [provider]  ketoconazole (NIZORAL) 2 % shampoo Apply 1 application topically 3 (three) times a week. 02/13/18   [provider]  Multiple Vitamins-Minerals (MULTIVITAMIN ADULT) TABS Take 1 tablet by mouth daily.    [provider]  naproxen (NAPROSYN) 500 MG tablet Take 1 tablet (500 mg total) by mouth 2 (two) times daily with a meal. 04/16/18   Jene EveryKinner, Robert, MD  ondansetron (ZOFRAN ODT) 4 MG disintegrating tablet Take 1 tablet (4 mg total) by mouth every 8 (eight) hours as needed for nausea or vomiting. 05/30/18   Sharman CheekStafford, Davyon Fisch, MD  oseltamivir (TAMIFLU) 75 MG capsule Take 1 capsule (75 mg total) by mouth 2 (two) times daily for 5 days. 05/30/18 06/04/18  Sharman CheekStafford, Madi Bonfiglio, MD     Allergies Augmentin [amoxicillin-pot clavulanate]   No family history on file.  Social History Social History   Tobacco Use  . Smoking status: Former Games developermoker  . Smokeless tobacco: Never Used  Substance Use Topics  . Alcohol use: No  . Drug use: Yes    Types: Marijuana    Review of Systems  Constitutional: Positive subjective fever and chills.  ENT:   No sore throat. No rhinorrhea. Cardiovascular:   No chest pain or syncope. Respiratory: Positive nonproductive cough.  No shortness of breath.   Gastrointestinal:   Negative for abdominal pain, positive for nausea and vomiting.  No constipation. Musculoskeletal:   Negative for focal pain or swelling.  Positive generalized body aches All other systems reviewed and are negative except as documented above in ROS and HPI.  ____________________________________________   PHYSICAL EXAM:  VITAL SIGNS: ED Triage Vitals  Enc Vitals Group     BP 05/30/18 1329 133/84     Pulse Rate 05/30/18 1329 82     Resp 05/30/18 1329 20     Temp 05/30/18 1329 98.4 F (36.9 C)     Temp Source 05/30/18 1329 Oral     SpO2 05/30/18  1329 99 %     Weight 05/30/18 1329 155 lb (70.3 kg)     Height 05/30/18 1329 4\' 11"  (1.499 m)     Head Circumference --      Peak Flow --      Pain Score 05/30/18 1333 10     Pain Loc --      Pain Edu? --      Excl. in GC? --     Vital signs reviewed, nursing assessments reviewed.   Constitutional:   Alert and oriented. Non-toxic appearance. Eyes:   Conjunctivae are normal. EOMI. PERRL. ENT      Head:   Normocephalic and atraumatic.      Nose:   No congestion/rhinnorhea.       Mouth/Throat:   MMM, no pharyngeal erythema. No peritonsillar mass.       Neck:   No meningismus. Full ROM. Hematological/Lymphatic/Immunilogical:   No cervical lymphadenopathy. Cardiovascular:   RRR. Symmetric bilateral radial and DP pulses.  No murmurs. Cap refill less than 2 seconds. Respiratory:   Normal respiratory effort without tachypnea/retractions. Breath sounds are clear and equal bilaterally. No wheezes/rales/rhonchi.  Slight inducible expiratory wheezing with cough. Gastrointestinal:   Soft and nontender. Non distended. There is no CVA tenderness.  No rebound, rigidity, or guarding. Musculoskeletal:   Normal range of motion in all extremities. No joint effusions.  No lower extremity tenderness.  No edema. Neurologic:   Normal speech and language.  Motor grossly intact. No acute focal neurologic deficits are appreciated.  Skin:    Skin is warm, dry and intact. No rash noted.  No petechiae, purpura, or bullae.  ____________________________________________    LABS (pertinent positives/negatives) (all labs ordered are listed, but only abnormal results are displayed) Labs Reviewed  COMPREHENSIVE METABOLIC PANEL - Abnormal; Notable for the following components:      Result Value   Potassium 2.9 (*)    Glucose, Bld 106 (*)    All other components within normal limits  BRAIN NATRIURETIC PEPTIDE - Abnormal; Notable for the following components:   B Natriuretic Peptide 501.0 (*)    All other  components within normal limits  INFLUENZA PANEL BY PCR (TYPE A & B) - Abnormal; Notable for the following components:   Influenza A By PCR POSITIVE (*)    All other components within normal limits  URINALYSIS, COMPLETE (UACMP) WITH MICROSCOPIC - Abnormal; Notable for the following components:   Color, Urine AMBER (*)    APPearance CLOUDY (*)    Specific Gravity, Urine 1.034 (*)    Hgb urine dipstick SMALL (*)    Ketones, ur 5 (*)    Protein, ur 100 (*)    Leukocytes,Ua TRACE (*)    Bacteria, UA RARE (*)    All other components within normal limits  URINE CULTURE  CBC WITH DIFFERENTIAL/PLATELET  TROPONIN I  POC URINE PREG, ED  POCT PREGNANCY, URINE   ____________________________________________  EKG  Interpreted by me Atrial sensed ventricular paced rhythm rate of 68, left axis, left bundle branch block.  No acute ischemic changes.  ____________________________________________    RADIOLOGY  Dg Chest 2 View  Result Date: 05/30/2018 CLINICAL DATA:  Chest pain. EXAM: CHEST - 2 VIEW COMPARISON:  06/02/2013 FINDINGS: Pacer with leads at right atrium and right ventricle. No lead discontinuity. Midline trachea. Borderline cardiomegaly. Mediastinal contours otherwise within normal limits. No pleural effusion or pneumothorax. IMPRESSION: Borderline cardiomegaly, without acute disease. Electronically Signed   By: Jeronimo Greaves M.D.   On: 05/30/2018 15:41    ____________________________________________   PROCEDURES Procedures  ____________________________________________    CLINICAL IMPRESSION / ASSESSMENT AND PLAN / ED COURSE  Medications ordered in the ED: Medications  oseltamivir (TAMIFLU) capsule 75 mg (has no administration in time range)  sodium chloride 0.9 % bolus 1,000 mL (0 mLs Intravenous Stopped 05/30/18 1925)  ketorolac (TORADOL) 30 MG/ML injection 15 mg (15 mg Intravenous Given 05/30/18 1728)  ondansetron (ZOFRAN) injection 4 mg (4 mg Intravenous Given 05/30/18  1725)  albuterol (PROVENTIL) (2.5 MG/3ML) 0.083% nebulizer solution 5 mg (5 mg Nebulization Given 05/30/18 1804)  dexamethasone (DECADRON) injection 10 mg (10 mg Intravenous Given 05/30/18 1804)    Pertinent labs & imaging results that were available during my care of the patient were reviewed by me and considered in my medical decision making (see chart for details).    Patient presents with body aches subjective fever and chills, nonproductive cough and nausea and vomiting for the past 2 days.  Positive for influenza A.  Labs overall unremarkable but consistent with a mild degree of dehydration.  Patient given IV fluids Zofran Toradol Decadron albuterol for symptomatic relief and is feeling better and tolerating oral intake.  Given the early course in her symptomatology cording to my interview, and her history of pacemaker, I will start on Tamiflu.  She is not in distress, I do not think she is at elevated risk for complication at this point and stable for outpatient follow-up, return precautions discussed.      ____________________________________________   FINAL CLINICAL IMPRESSION(S) / ED DIAGNOSES    Final diagnoses:  Influenza A  Acute gastritis without hemorrhage, unspecified gastritis type     ED Discharge Orders         Ordered    oseltamivir (TAMIFLU) 75 MG capsule  2 times daily     05/30/18 2007    famotidine (PEPCID) 20 MG tablet  2 times daily     05/30/18 2007    ondansetron (ZOFRAN ODT) 4 MG disintegrating tablet  Every 8 hours PRN     05/30/18 2007          Portions of this note were generated with dragon dictation software. Dictation errors may occur despite best attempts at proofreading.   Sharman Cheek, MD 05/30/18 2015

## 2018-05-30 NOTE — ED Triage Notes (Signed)
Patient c/o fever, vomiting and body aches for 4 days. Patient has a pacemaker. Patient states she felt dizzy last night and fell. Patient denies LOC, but does c/o headache.

## 2018-05-30 NOTE — ED Notes (Signed)
.  Peripheral IV discontinued. Catheter intact. No signs of infiltration or redness. Gauze applied to IV site.   Discharge instructions reviewed with patient. Questions fielded by this RN. Patient verbalizes understanding of instructions. Patient discharged home in stable condition per stafford . No acute distress noted at time of discharge.   

## 2018-06-01 LAB — URINE CULTURE: Culture: NO GROWTH

## 2019-08-04 IMAGING — CT CT RENAL STONE PROTOCOL
3 of 4 series · 9 of 46 positions shown, 14 images · non-contrast
Comparison: CT abdomen and pelvis - 01/13/2015; chest
radiograph-06/12/2013; chest CT-06/12/2013

CLINICAL DATA: Left-sided flank pain for the past 2 weeks, severe
for the past 2 days.

EXAM:
CT ABDOMEN AND PELVIS WITHOUT CONTRAST
TECHNIQUE: Multidetector CT imaging of the abdomen and pelvis was performed
following the standard protocol without IV contrast.

[Series 4: lung bases · axial · 0.59mm/px · z∈[-647,-567]mm · 5 of 26 slices shown, 10 images]
[im 5/26  soft-tissue]
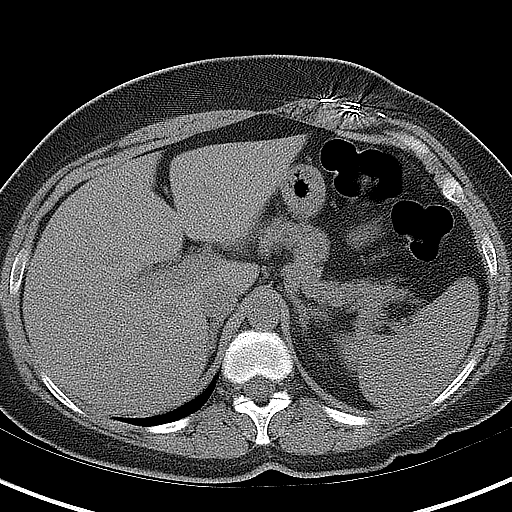
[im 5/26  bone]
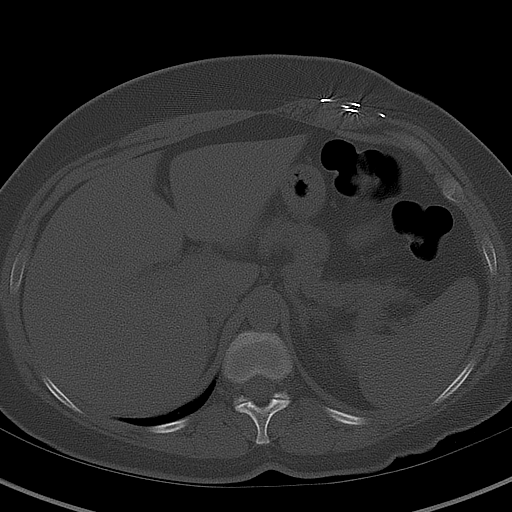
[im 9/26  soft-tissue]
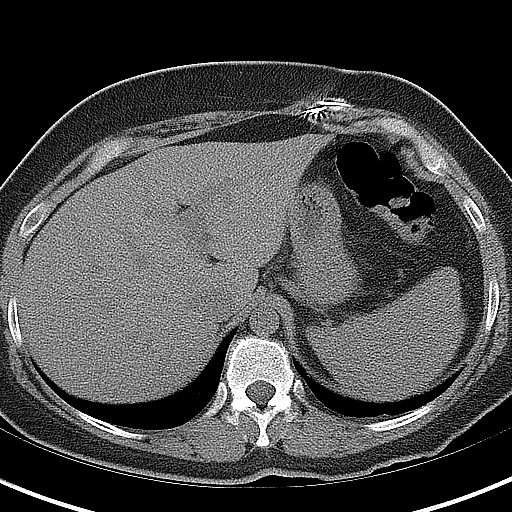
[im 9/26  lung]
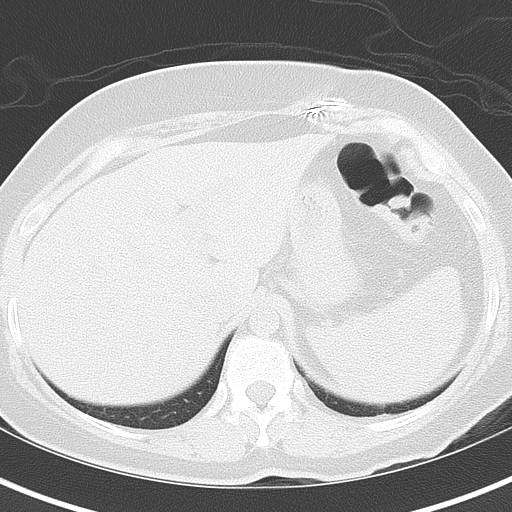
[im 13/26  soft-tissue]
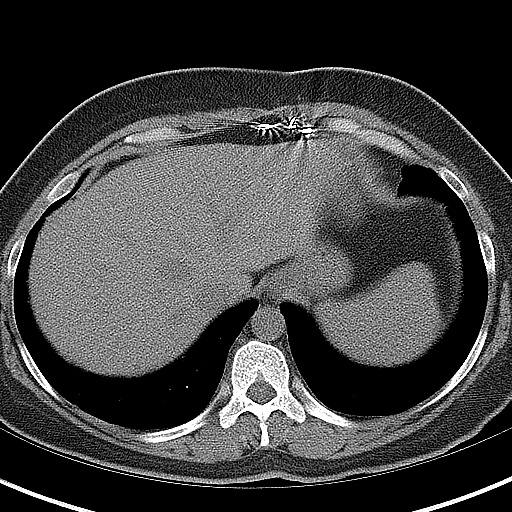
[im 13/26  lung]
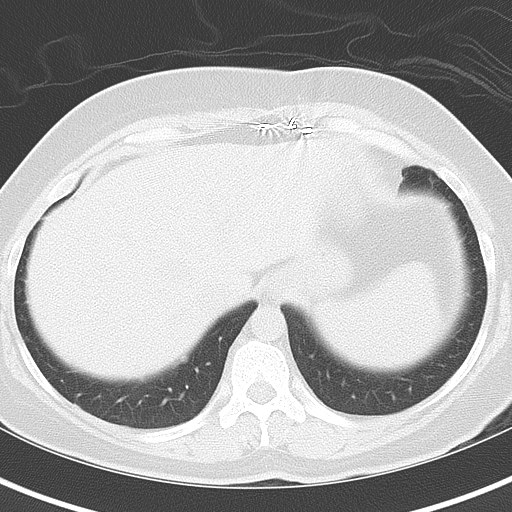
[im 17/26  soft-tissue]
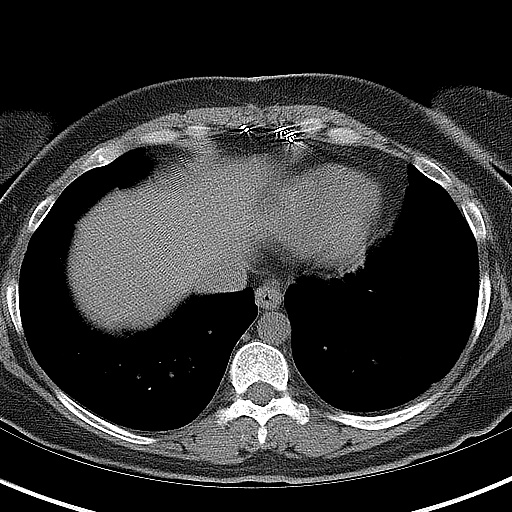
[im 17/26  lung]
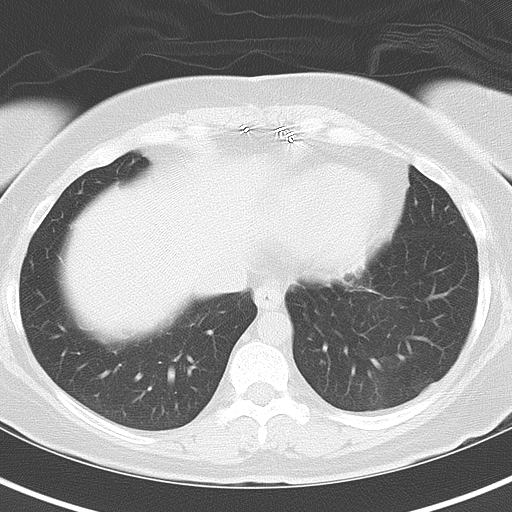
[im 21/26  soft-tissue]
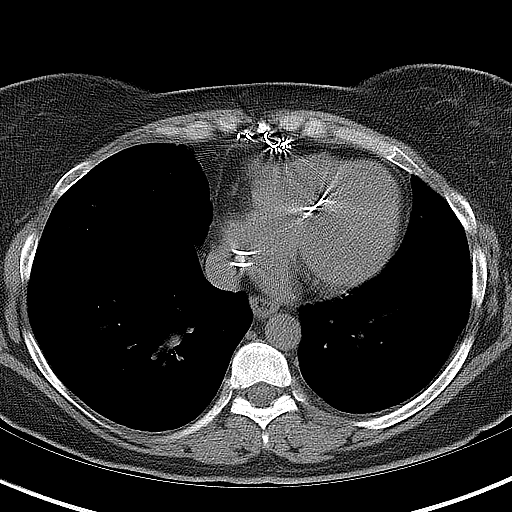
[im 21/26  lung]
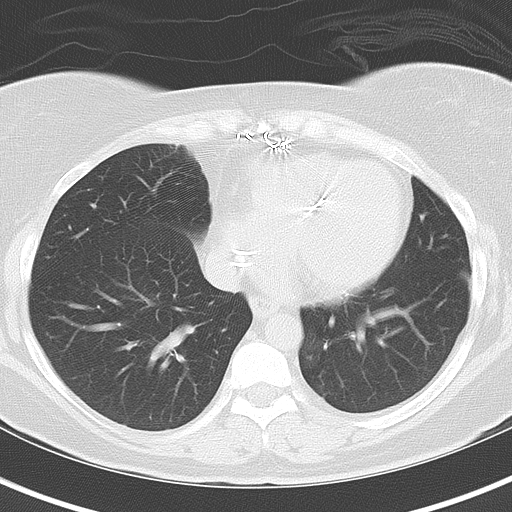

[Series 5: coronal · coronal · 0.71mm/px · 3 of 134 slices shown]
[im 45/134  soft-tissue]
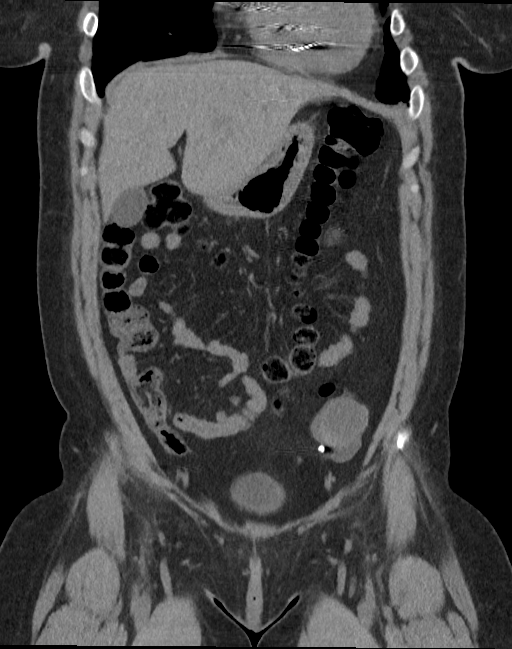
[im 60/134  soft-tissue]
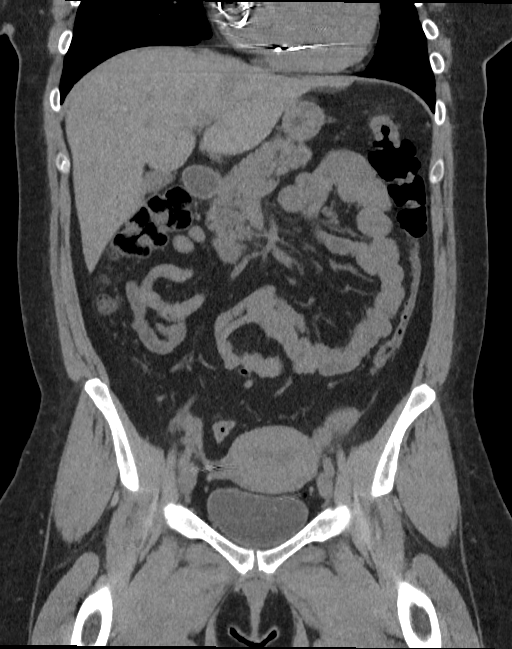
[im 74/134  soft-tissue]
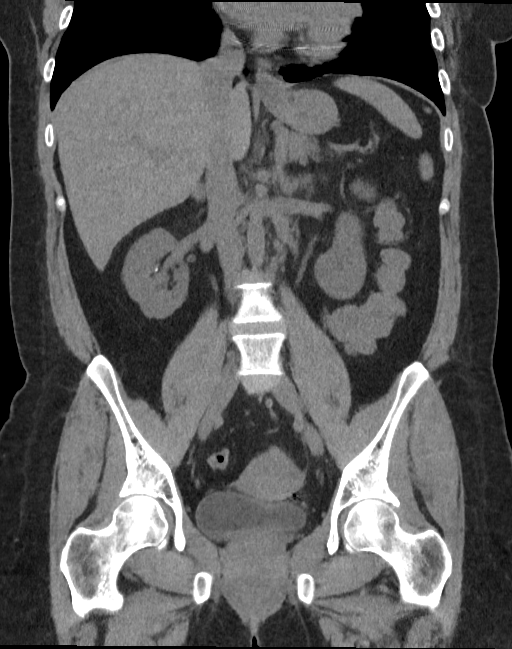

[Series 6: sagittal · sagittal · 0.52mm/px · 1 of 170 slices shown]
[im 57/170  soft-tissue]
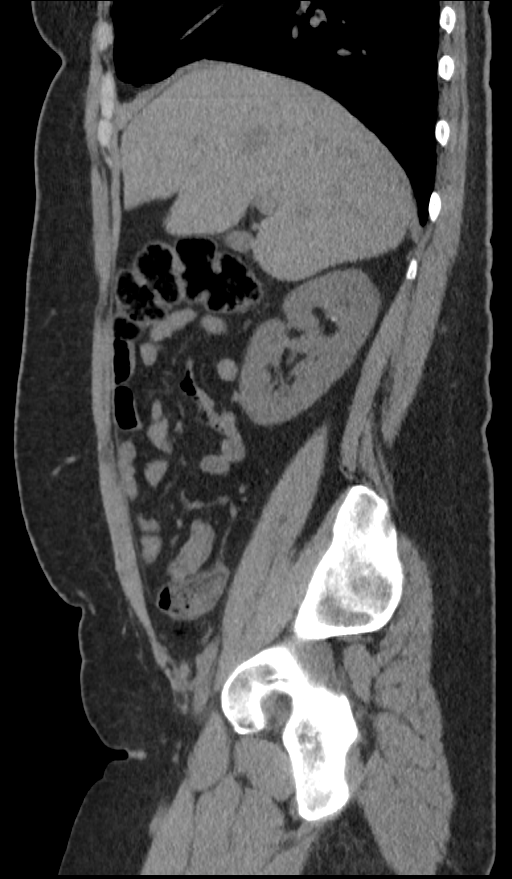

[9 of 46 positions shown; findings below may reference images not displayed]

FINDINGS: The lack of intravenous contrast limits the ability to evaluate
solid abdominal organs.

Lower chest: Limited visualization of the lower thorax is negative
for focal airspace opacity or pleural effusion.

Pacer leads terminate within the right ventricle and presumably
right atrium with additional abandoned pacer leads imbedded within
the subcutaneous tissues of left upper abdomen, unchanged.

Hepatobiliary: Normal hepatic contour.

Normal noncontrast appearance of the gallbladder given degree
distention. No radiopaque gallstones. No intra or extrahepatic
biliary duct dilatation. No ascites.

Pancreas: Normal noncontrast appearance of the pancreas.

Spleen: Normal noncontrast appearance of the spleen. Note is made of
a small splenule.

Adrenals/Urinary Tract: There are 2 punctate nonobstructing stones
within a nondilated posterior midpole left-sided renal calyx
There are 2 punctate (2-3 mm) nonobstructing stones within the
interpolar aspect the right kidney (axial image 35 and 39, series
2). No definite renal stones are seen along the expected course of
either ureter.

Normal noncontrast appearance of the urinary bladder given degree of
distention.

There is a very minimal left-sided perinephric stranding (image 34,
series 2).

Normal noncontrast appearance of the bilateral adrenal glands.

Stomach/Bowel: Moderate colonic stool burden without evidence of
enteric obstruction. Normal noncontrast appearance of the terminal
ileum and the retrocecal appendix. No pneumoperitoneum, pneumatosis
or portal venous gas.

Vascular/Lymphatic: Normal caliber of the abdominal aorta.

No bulky retroperitoneal, mesenteric, pelvic or inguinal
lymphadenopathy on this noncontrast examination.

Reproductive: Post bilateral tubal ligation. Note is made of a
approximately 3.4 x 2.8 x 3.7 cm hypoattenuating (20 Hounsfield
unit) left-sided adnexal lesion, presumably a physiologic cyst.
Left-sided fallopian tube appears mildly dilated (coronal images 45
and 46, series 5), similar to the [DATE] examination and likely the
sequela of prior tubal ligation. No associated peri adnexal
stranding. No definitive right-sided adnexal lesion on this
postcontrast examination. There is a small amount of presumably
physiologic free fluid in the pelvic cul-de-sac.

Other: Tiny mesenteric fat containing periumbilical hernia. Minimal
subcutaneous edema about the midline of the low back.

Musculoskeletal: No acute or aggressive osseous abnormalities.
IMPRESSION: 1. Bilateral nonobstructing nephrolithiasis as above.
2. Very minimal amount of asymmetric left-sided perinephric
stranding without evidence of urinary obstruction, nonspecific,
though could be seen in the setting of recently passed
nephrolithiasis, though infection could have a similar appearance.
Correlation with urinalysis is advised.
3. Approximately 3.7 cm left-sided adnexal lesion, presumably a
physiologic cyst, with small amount of presumably physiologic fluid
within the pelvic cul-de-sac. Further evaluation with pelvic
ultrasound could be performed as indicated.

## 2022-10-08 ENCOUNTER — Other Ambulatory Visit: Payer: Self-pay

## 2022-10-08 ENCOUNTER — Encounter: Payer: Medicaid Other | Attending: Internal Medicine

## 2022-10-08 DIAGNOSIS — Z952 Presence of prosthetic heart valve: Secondary | ICD-10-CM

## 2022-10-08 NOTE — Progress Notes (Signed)
Virtual Visit completed. Patient informed on EP and RD appointment and 6 Minute walk test. Patient also informed of patient health questionnaires on My Chart. Patient Verbalizes understanding. Visit diagnosis can be found in [facility] 09/01/2022.

## 2022-10-29 ENCOUNTER — Ambulatory Visit: Payer: Medicaid Other
